# Patient Record
Sex: Female | Born: 1980 | Race: Black or African American | Hispanic: No | Marital: Single | Smoking: Current some day smoker
Health system: Southern US, Community
[De-identification: ages and names within clinical notes are randomized; demographics above are authoritative.]

## PROBLEM LIST (undated history)

## (undated) DIAGNOSIS — I10 Essential (primary) hypertension: Secondary | ICD-10-CM

## (undated) DIAGNOSIS — A4902 Methicillin resistant Staphylococcus aureus infection, unspecified site: Secondary | ICD-10-CM

## (undated) HISTORY — PX: INCISION AND DRAINAGE: SHX5863

---
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
## 2001-08-16 ENCOUNTER — Other Ambulatory Visit: Admission: RE | Admit: 2001-08-16 | Discharge: 2001-08-16 | Payer: Self-pay | Admitting: Obstetrics and Gynecology

## 2004-05-28 ENCOUNTER — Other Ambulatory Visit: Admission: RE | Admit: 2004-05-28 | Discharge: 2004-05-28 | Payer: Self-pay | Admitting: Obstetrics and Gynecology

## 2007-07-03 ENCOUNTER — Emergency Department (HOSPITAL_COMMUNITY): Admission: EM | Admit: 2007-07-03 | Discharge: 2007-07-04 | Payer: Self-pay | Admitting: Emergency Medicine

## 2007-07-24 ENCOUNTER — Encounter: Admission: RE | Admit: 2007-07-24 | Discharge: 2007-08-25 | Payer: Self-pay | Admitting: Family Medicine

## 2008-04-09 ENCOUNTER — Emergency Department (HOSPITAL_COMMUNITY): Admission: EM | Admit: 2008-04-09 | Discharge: 2008-04-09 | Payer: Self-pay | Admitting: Emergency Medicine

## 2008-09-16 ENCOUNTER — Emergency Department (HOSPITAL_COMMUNITY): Admission: EM | Admit: 2008-09-16 | Discharge: 2008-09-16 | Payer: Self-pay | Admitting: Emergency Medicine

## 2008-12-25 ENCOUNTER — Emergency Department (HOSPITAL_COMMUNITY): Admission: EM | Admit: 2008-12-25 | Discharge: 2008-12-25 | Payer: Self-pay | Admitting: Emergency Medicine

## 2009-07-18 ENCOUNTER — Emergency Department (HOSPITAL_COMMUNITY): Admission: EM | Admit: 2009-07-18 | Discharge: 2009-07-18 | Payer: Self-pay | Admitting: Emergency Medicine

## 2009-07-23 ENCOUNTER — Emergency Department (HOSPITAL_COMMUNITY): Admission: EM | Admit: 2009-07-23 | Discharge: 2009-07-23 | Payer: Self-pay | Admitting: Emergency Medicine

## 2010-02-09 ENCOUNTER — Emergency Department (HOSPITAL_COMMUNITY): Admission: EM | Admit: 2010-02-09 | Discharge: 2010-02-09 | Payer: Self-pay | Admitting: Emergency Medicine

## 2010-09-17 ENCOUNTER — Emergency Department (HOSPITAL_COMMUNITY): Admission: EM | Admit: 2010-09-17 | Discharge: 2010-09-17 | Payer: Self-pay | Admitting: Emergency Medicine

## 2010-09-21 ENCOUNTER — Emergency Department (HOSPITAL_COMMUNITY): Admission: EM | Admit: 2010-09-21 | Discharge: 2010-09-21 | Payer: Self-pay | Admitting: Emergency Medicine

## 2010-12-03 ENCOUNTER — Emergency Department (HOSPITAL_COMMUNITY): Admission: EM | Admit: 2010-12-03 | Discharge: 2010-05-28 | Payer: Self-pay | Admitting: Emergency Medicine

## 2011-04-04 LAB — CULTURE, ROUTINE-ABSCESS

## 2011-04-23 ENCOUNTER — Emergency Department (HOSPITAL_COMMUNITY)
Admission: EM | Admit: 2011-04-23 | Discharge: 2011-04-23 | Disposition: A | Discharge status: Home / Self Care | Payer: Medicaid Other | Attending: Emergency Medicine | Admitting: Emergency Medicine

## 2011-04-23 DIAGNOSIS — IMO0002 Reserved for concepts with insufficient information to code with codable children: Secondary | ICD-10-CM | POA: Insufficient documentation

## 2011-05-30 ENCOUNTER — Emergency Department (INDEPENDENT_AMBULATORY_CARE_PROVIDER_SITE_OTHER): Payer: Medicaid Other

## 2011-05-30 ENCOUNTER — Emergency Department (HOSPITAL_BASED_OUTPATIENT_CLINIC_OR_DEPARTMENT_OTHER)
Admission: EM | Admit: 2011-05-30 | Discharge: 2011-05-30 | Disposition: A | Discharge status: Home / Self Care | Payer: Medicaid Other | Attending: Emergency Medicine | Admitting: Emergency Medicine

## 2011-05-30 DIAGNOSIS — R079 Chest pain, unspecified: Secondary | ICD-10-CM | POA: Insufficient documentation

## 2011-05-30 DIAGNOSIS — R071 Chest pain on breathing: Secondary | ICD-10-CM | POA: Insufficient documentation

## 2011-10-12 LAB — DIFFERENTIAL
Basophils Absolute: 0
Eosinophils Absolute: 0
Eosinophils Relative: 0
Lymphocytes Relative: 26
Lymphs Abs: 1.4
Monocytes Relative: 7
Neutro Abs: 3.7
Neutrophils Relative %: 67

## 2011-10-12 LAB — BASIC METABOLIC PANEL
BUN: 4 — ABNORMAL LOW
Calcium: 9.4
Chloride: 100
Creatinine, Ser: 0.98
Sodium: 134 — ABNORMAL LOW

## 2011-10-12 LAB — CBC
Hemoglobin: 13.5
Platelets: 203
WBC: 5.5

## 2011-10-12 LAB — URINALYSIS, ROUTINE W REFLEX MICROSCOPIC
Glucose, UA: NEGATIVE
Hgb urine dipstick: NEGATIVE
Protein, ur: NEGATIVE

## 2012-06-16 ENCOUNTER — Encounter (HOSPITAL_COMMUNITY): Payer: Self-pay | Admitting: Emergency Medicine

## 2012-06-16 ENCOUNTER — Emergency Department (HOSPITAL_COMMUNITY)
Admission: EM | Admit: 2012-06-16 | Discharge: 2012-06-17 | Disposition: A | Discharge status: Home / Self Care | Payer: Medicaid Other | Attending: Emergency Medicine | Admitting: Emergency Medicine

## 2012-06-16 DIAGNOSIS — S0180XA Unspecified open wound of other part of head, initial encounter: Secondary | ICD-10-CM | POA: Insufficient documentation

## 2012-06-16 DIAGNOSIS — W19XXXA Unspecified fall, initial encounter: Secondary | ICD-10-CM | POA: Insufficient documentation

## 2012-06-16 HISTORY — DX: Methicillin resistant Staphylococcus aureus infection, unspecified site: A49.02

## 2012-06-16 NOTE — ED Notes (Signed)
Pt states she was walking to the concert and fell  Pt has a small laceration noted above her left eyebrow  Pt has been drinking tonight

## 2012-06-20 ENCOUNTER — Emergency Department (HOSPITAL_COMMUNITY): Payer: Self-pay

## 2012-06-20 ENCOUNTER — Encounter (HOSPITAL_COMMUNITY): Payer: Self-pay | Admitting: Emergency Medicine

## 2012-06-20 ENCOUNTER — Emergency Department (HOSPITAL_COMMUNITY)
Admission: EM | Admit: 2012-06-20 | Discharge: 2012-06-20 | Disposition: A | Discharge status: Home / Self Care | Payer: Self-pay | Attending: Emergency Medicine | Admitting: Emergency Medicine

## 2012-06-20 DIAGNOSIS — R51 Headache: Secondary | ICD-10-CM | POA: Insufficient documentation

## 2012-06-20 DIAGNOSIS — W010XXA Fall on same level from slipping, tripping and stumbling without subsequent striking against object, initial encounter: Secondary | ICD-10-CM | POA: Insufficient documentation

## 2012-06-20 DIAGNOSIS — M25519 Pain in unspecified shoulder: Secondary | ICD-10-CM | POA: Insufficient documentation

## 2012-06-20 LAB — POCT PREGNANCY, URINE: Preg Test, Ur: NEGATIVE

## 2012-06-20 MED ORDER — METHOCARBAMOL 500 MG PO TABS: 1000.0000 mg | ORAL_TABLET | Freq: Two times a day (BID) | ORAL | Status: AC

## 2012-06-20 MED ORDER — KETOROLAC TROMETHAMINE 60 MG/2ML IM SOLN
60.0000 mg | Freq: Once | INTRAMUSCULAR | Status: AC
  Administered 2012-06-20: 60 mg via INTRAMUSCULAR
  Filled 2012-06-20: qty 2

## 2012-06-20 MED ORDER — METHOCARBAMOL 500 MG PO TABS
1000.0000 mg | ORAL_TABLET | Freq: Once | ORAL | Status: AC
  Administered 2012-06-20: 1000 mg via ORAL
  Filled 2012-06-20: qty 2

## 2012-06-20 MED ORDER — IBUPROFEN 600 MG PO TABS: 600.0000 mg | ORAL_TABLET | Freq: Four times a day (QID) | ORAL | Status: AC | PRN

## 2012-06-20 NOTE — ED Notes (Signed)
Pt fell on Friday night while intoxicated  Pt came to hospital and left AMA  Pt states since her fall she has had headaches off and on and this evening she woke up and states her head feels funny and has a lot of pressure  Pt states she still having pain in her left side of her neck and shoulder that shoot down into her back

## 2012-06-20 NOTE — Discharge Instructions (Signed)
Your head CT and xray were normal and did not show signs of any broken bones or head injury. You have been given a prescription for ibuprofen and robaxin, a muscle relaxer. Take these as needed. Return to the ED with worsening pain, nausea/vomiting, or any other worrisome symptoms.  RESOURCE GUIDE  Chronic Pain Problems: Contact [facility]  [phone] Patients need to be referred by their primary care doctor.  Insufficient Money for Medicine: Contact United Way:  call "211" or [facility] 785-424-7883.  No Primary Care Doctor: - Call [facility]  [phone] - can help you locate a primary care doctor that  accepts your insurance, provides certain services, etc. - Physician Referral Service- [phone]  Agencies that provide inexpensive medical care: - [facility]  [phone] - [facility]  [phone] - [facility]  [phone] - Women's Clinic  [phone] - Planned Parenthood  [phone] [facility]  [phone]  Medicaid-accepting [facility] Providers: - [facility]- [address]  [phone], Mon-Fri 9am-7pm, Sat 9am-1pm - [facility]- [address] [place], Suite [place]  [phone] - [facility]- [address] [place]  [phone] [facility]- [address]  [phone] - Renaye Rakers- [address] [place], [address], [phone]  Only accepts [place] patients after they have their name  applied to their card  Self Pay (no insurance) in [place]: - Sickle Cell Patients: Dr Willey Blade, [place] [facility]  [address] [place], [phone] - [facility]- [address] [place]  [phone]       [facility] [place]- [address]       -     [facility]- see information above (Speak to [employer] if you do not have insurance)       -   [facility]- [address] [place], [phone]       -  [facility]- [address],  [phone]       -  [facility]- [address], [phone]       -  Dr Julio Sicks-  [address], [place], [phone]       -  [facility]- [address], [phone]       -  [facility]- [address], [phone], also [address] [phone]       -    [facility]- [address] [place], [phone], 1st & 3rd Saturday   every month, 10am-1pm  1) Find a Doctor and Pay Out of Pocket Although you won't have to find out who is covered by your insurance plan, it is a good idea to ask around and get recommendations. You will then need to call the office and see if the doctor you have chosen will accept you as a new patient and what types of options they offer for patients who are self-pay. Some doctors offer discounts or will set up payment plans for their patients who do not have insurance, but you will need to ask so you aren't surprised when you get to your appointment.  2) Contact Your Local Health Department Not all health departments have doctors that can see patients for sick visits, but  many do, so it is worth a call to see if yours does. If you don't know where your local health department is, you can check in your phone book. The CDC also has a tool to help you locate your state's health department, and many state websites also have listings of all of their local health departments.  3) Find a Walk-in Clinic If your illness is not likely to be very severe or complicated, you may want to try a walk in clinic. These are popping up all over the country in pharmacies, drugstores, and shopping centers. They're usually staffed by nurse practitioners or physician assistants that have been trained to treat common illnesses and complaints. They're usually fairly quick and inexpensive. However, if you have serious medical issues or  chronic medical problems, these are probably not your best option  STD Testing - [facility] Department of [facility] [place], STD Clinic, [address], [place], phone [phone] or [phone].  Monday - Friday, call for an appointment. [facility] Department of [employer], STD Clinic, [address], [place], phone [phone] or [phone].  Monday - Friday, call for an appointment.  Abuse/Neglect: [facility] Child Abuse Hotline 6503403139 [facility] Child Abuse Hotline (786)703-9561 (After Hours)  Emergency Shelter:  [facility] 318-301-5377  Maternity Homes: - [facility] at the [place] [facility] (303)286-9013 - [facility] 7097963182  MRSA Hotline #:   (361)751-9700  [facility] Resources  [facility]  [employer] [facility] Dept. [address]                 [address]         [address] [place] [address]  Blondell Reveal Phone:  [phone]                                  Phone:  [phone]                   Phone:  7277386046  [facility], [phone] - [facility] - [phone]       -     [facility] in [place], [address]                                  [phone], [facility] Child Abuse Hotline 843-547-4301 or 651-761-0936 (After Hours)   Behavioral Health Services  Substance Abuse Resources: - [facility]  (657) [phone] - [facility] [phone] - [facility] [place] (629)041-3410 Floydene Flock [phone] - Residential & Outpatient Substance Abuse Program  [phone]  Psychological Services: [facility]  8030649489 Services  [phone] - [facility], (507)330-4261 [place]. [address], [place], ACCESS LINE: (838)560-8517 or 347-348-7878,  [employer]an.[employer].za  Dental Assistance  If unable to pay or uninsured, contact:  [facility] or [facility]. to become qualified for the adult dental clinic.  Patients with Medicaid: [facility] [phone] [address], [phone] [address], [phone]  If unable to pay, or uninsured, contact [facility] (301) [phone]) or [facility] Department 9347119297 in [place], [phone] in [facility]) to become qualified for the adult dental clinic  Other Low-Cost Community Dental Services: - [facility]- [address] [place], [place], [place], [address], [phone], [address]. [phone], 2nd and 4th Thursday of the month at 6:30am.  10 clients each day by appointment, can sometimes see walk-in patients if someone does not show for an appointment. [facility]- [address] [place], [place], [address], [phone] - [facility]- [address], [place], [place], [address], [phone] [facility] Department- [phone] [facility] Department- [phone] [facility] [phone]  General Headache, Without Cause A general headache has no specific cause. These headaches are not life-threatening. They will not lead to other types of headaches. HOME CARE   Make and keep follow-up visits with your doctor.   Only take medicine as told by your doctor.   Try to relax, get a massage, or use your thoughts to control your body (biofeedback).   Apply cold or heat to the head and neck. Apply 3 or 4 times a day or as needed.  Finding out the results of your test Ask when your test results will be ready. Make sure you get your test results. GET HELP RIGHT AWAY IF:   You have problems with medicine.   Your medicine does not help relieve pain.   Your headache changes or becomes worse.   You feel sick  to your stomach (nauseous) or throw up (vomit).   You have a temperature by mouth above 102 F (38.9 C), not controlled by medicine.   Your have a stiff neck.   You have vision loss.   You have muscle weakness.   You lose control of your muscles.   You lose balance or have trouble walking.   You feel like you are going to pass out (faint).  MAKE SURE YOU:   Understand these instructions.   Will watch this condition.   Will get help right away if you are not doing well or get worse.  Document Released: 09/21/2008 Document Revised: 12/02/2011 Document Reviewed: 09/21/2008 [facility] Patient Information 2012 [place], [place].

## 2012-06-20 NOTE — ED Provider Notes (Signed)
History     CSN: [id number]  Arrival date & time 06/20/12  1931   First MD Initiated Contact with Patient 06/20/12 2052      Chief Complaint  Patient presents with  . Headache    (Consider location/radiation/quality/duration/timing/severity/associated sxs/prior treatment) HPI History from patient. 31 year old female who presents with headache and left shoulder pain. She states that she was walking outside on Friday night when she tripped and fell on the pavement, striking her forehead. She believes that she had a brief loss of consciousness with this. She had been drinking alcohol that evening. She came to the emergency department for evaluation but left before being seen. States that since this she has had persistent headaches to the frontal area which are described as dull and throbbing. She has had some slight associated dizziness, described as a sensation of lightheadedness. She denies any visual changes, photophobia, phonophobia, nausea, vomiting. No medication at home prior to coming here. Pain in left shoulder is located to posterior shoulder. It does not radiate. Worsens with movement. Denies numbness or weakness in her arm or hand.   Past Medical History  Diagnosis Date  . MRSA (methicillin resistant Staphylococcus aureus)     History reviewed. No pertinent past surgical history.  Family History  Problem Relation Age of Onset  . Hypertension Other   . Diabetes Other   . Coronary artery disease Other     History  Substance Use Topics  . Smoking status: Current Everyday Smoker    Types: Cigarettes  . Smokeless tobacco: Not on file  . Alcohol Use: Yes     heavy    OB History    Grav Para Term Preterm Abortions TAB SAB Ect Mult Living                  Review of Systems  Constitutional: Negative for fever, chills, activity change and appetite change.  HENT: Negative for neck pain and neck stiffness.   Respiratory: Negative for cough, chest tightness and shortness  of breath.   Cardiovascular: Negative for chest pain.  Musculoskeletal: Positive for myalgias.  Neurological: Positive for headaches. Negative for dizziness, weakness, light-headedness and numbness.  All other systems reviewed and are negative.    Allergies  Review of patient's allergies indicates no known allergies.  Home Medications   Current Outpatient Rx  Name Route Sig Dispense Refill  . IBUPROFEN 200 MG PO TABS Oral Take 200 mg by mouth every 6 (six) hours as needed.      BP 128/84  Pulse 94  Temp 97.9 F (36.6 C) (Oral)  Resp 20  SpO2 100%  LMP 05/28/2012  Physical Exam  Nursing note and vitals reviewed. Constitutional: She is oriented to person, place, and time. She appears well-developed and well-nourished. No distress.  HENT:  Head: Normocephalic.  Right Ear: External ear normal.  Left Ear: External ear normal.  Mouth/Throat: Oropharynx is clear and moist. No oropharyngeal exudate.       Negative battle's, raccoon's, hemotympanum  Eyes: EOM are normal. Pupils are equal, round, and reactive to light.  Neck: Normal range of motion. Neck supple.  Cardiovascular: Normal rate, regular rhythm and normal heart sounds.   Pulmonary/Chest: Effort normal and breath sounds normal. She exhibits no tenderness.  Abdominal: Soft. Bowel sounds are normal. There is no tenderness. There is no rebound and no guarding.  Musculoskeletal: Normal range of motion.       Left shoulder: She exhibits tenderness. She exhibits no bony tenderness, no swelling,  no effusion and no crepitus.       Arms: Neurological: She is alert and oriented to person, place, and time. No cranial nerve deficit. She exhibits normal muscle tone. Coordination normal.  Skin: Skin is warm and dry. She is not diaphoretic.       Healing laceration to the left lateral eyebrow, clean appearing  Psychiatric: She has a normal mood and affect.    ED Course  Procedures (including critical care time)  Labs Reviewed -  No data to display Ct Head Wo Contrast  06/20/2012  *RADIOLOGY REPORT*  Clinical Data: 31 year old female status post fall with headache.  CT HEAD WITHOUT CONTRAST  Technique:  Contiguous axial images were obtained from the base of the skull through the vertex without contrast.  Comparison: 09/17/2010.  Findings: Visualized paranasal sinuses and mastoids are clear. Visualized orbits and scalp soft tissues are within normal limits. No acute osseous abnormality identified.  Cerebral volume is within normal limits for age.  No midline shift, ventriculomegaly, mass effect, evidence of mass lesion, intracranial hemorrhage or evidence of cortically based acute infarction.  Gray-white matter differentiation is within normal limits throughout the brain.  No suspicious intracranial vascular hyperdensity.  IMPRESSION: Stable and normal noncontrast CT appearance of the brain.  Original Report Authenticated By: Harley Hallmark, M.D.   Dg Shoulder Left  06/20/2012  *RADIOLOGY REPORT*  Clinical Data: 31 year old female status post fall with pain.  LEFT SHOULDER - 2+ VIEW  Comparison: 09/17/2010.  Findings: Hair artifact over the left neck and lung apex. No glenohumeral joint dislocation.   Bone mineralization is within normal limits.  Proximal left humerus, left clavicle, and left scapula appears stable and intact.  Negative visualized left ribs and lung parenchyma.  IMPRESSION: No acute fracture or dislocation identified about the left shoulder.  Original Report Authenticated By: Harley Hallmark, M.D.     1. Headache       MDM  Patient presents with headache after a fall on Friday night. She had been drinking alcohol that evening. Has not been evaluated for this headache yet. No neurologic deficits on exam. Imaging of the shoulder and head are negative for acute findings. Patient instructed on symptomatic treatment. Prescriptions for ibuprofen and Robaxin. Reasons to return to the emergency department discussed. She  verbalized understanding and agreed to this plan.        Grant Fontana, PA-C 06/21/12 1601

## 2012-06-22 NOTE — ED Provider Notes (Signed)
Medical screening examination/treatment/procedure(s) were performed by non-physician practitioner and as supervising physician I was immediately available for consultation/collaboration.  Iraida Cragin T Alette Kataoka, MD 06/22/12 2315 

## 2013-02-13 ENCOUNTER — Encounter (HOSPITAL_COMMUNITY): Payer: Self-pay | Admitting: Emergency Medicine

## 2013-02-13 ENCOUNTER — Emergency Department (HOSPITAL_COMMUNITY)
Admission: EM | Admit: 2013-02-13 | Discharge: 2013-02-13 | Disposition: A | Discharge status: Home / Self Care | Payer: Medicaid Other | Attending: Emergency Medicine | Admitting: Emergency Medicine

## 2013-02-13 DIAGNOSIS — F17200 Nicotine dependence, unspecified, uncomplicated: Secondary | ICD-10-CM | POA: Insufficient documentation

## 2013-02-13 DIAGNOSIS — M545 Low back pain, unspecified: Secondary | ICD-10-CM | POA: Insufficient documentation

## 2013-02-13 DIAGNOSIS — Z791 Long term (current) use of non-steroidal anti-inflammatories (NSAID): Secondary | ICD-10-CM | POA: Insufficient documentation

## 2013-02-13 DIAGNOSIS — Z8614 Personal history of Methicillin resistant Staphylococcus aureus infection: Secondary | ICD-10-CM | POA: Insufficient documentation

## 2013-02-13 MED ORDER — HYDROCODONE-ACETAMINOPHEN 5-325 MG PO TABS
1.0000 | ORAL_TABLET | Freq: Once | ORAL | Status: AC
  Administered 2013-02-13: 1 via ORAL
  Filled 2013-02-13: qty 1

## 2013-02-13 MED ORDER — CYCLOBENZAPRINE HCL 10 MG PO TABS: 10.0000 mg | ORAL_TABLET | Freq: Two times a day (BID) | ORAL | Status: DC | PRN

## 2013-02-13 MED ORDER — HYDROCODONE-ACETAMINOPHEN 5-325 MG PO TABS: 1.0000 | ORAL_TABLET | ORAL | Status: DC | PRN

## 2013-02-13 NOTE — ED Provider Notes (Signed)
History     CSN: [id number]  Arrival date & time 02/13/13  1157   First MD Initiated Contact with Patient 02/13/13 1304      Chief Complaint  Patient presents with  . Back Pain    (Consider location/radiation/quality/duration/timing/severity/associated sxs/prior treatment) Patient is a 32 y.o. female presenting with back pain. The history is provided by the patient.  Back Pain Location:  Lumbar spine Associated symptoms: no fever and no numbness   Associated symptoms comment:  Recurrent lower back pain for one week without known injury. She has had multiple episodes in the past without diagnosis. No numbness, incontinence, weakness.    Past Medical History  Diagnosis Date  . MRSA (methicillin resistant Staphylococcus aureus)     History reviewed. No pertinent past surgical history.  Family History  Problem Relation Age of Onset  . Hypertension Other   . Diabetes Other   . Coronary artery disease Other     History  Substance Use Topics  . Smoking status: Current Every Day Smoker    Types: Cigarettes  . Smokeless tobacco: Not on file  . Alcohol Use: Yes     Comment: heavy    OB History   Grav Para Term Preterm Abortions TAB SAB Ect Mult Living                  Review of Systems  Constitutional: Negative for fever and chills.  Gastrointestinal: Negative.  Negative for nausea.  Genitourinary: Negative.  Negative for difficulty urinating.  Musculoskeletal: Positive for back pain.  Skin: Negative.   Neurological: Negative.  Negative for numbness.    Allergies  Review of patient's allergies indicates no known allergies.  Home Medications   Current Outpatient Rx  Name  Route  Sig  Dispense  Refill  . Celecoxib (CELEBREX PO)   Oral   Take 1 tablet by mouth 2 (two) times daily.         . cyclobenzaprine (FLEXERIL) 10 MG tablet   Oral   Take 10 mg by mouth at bedtime as needed for muscle spasms.           BP 129/89  Pulse 86  Temp(Src) 98 F  (36.7 C) (Oral)  Resp 18  SpO2 98%  Physical Exam  Constitutional: She appears well-developed and well-nourished.  HENT:  Head: Normocephalic.  Neck: Normal range of motion. Neck supple.  Cardiovascular: Normal rate and regular rhythm.   Pulmonary/Chest: Effort normal and breath sounds normal.  Abdominal: Soft. Bowel sounds are normal. There is no tenderness. There is no rebound and no guarding.  Musculoskeletal: Normal range of motion.  Mild lumbar and paralumbar tenderness without swelling or discoloration.  Neurological: She is alert. She has normal reflexes. No cranial nerve deficit. Coordination normal.  Skin: Skin is warm and dry. No rash noted.  Psychiatric: She has a normal mood and affect.    ED Course  Procedures (including critical care time)  Labs Reviewed - No data to display No results found.   No diagnosis found.  1. Low back pain  MDM  Musculoskeletal back pain without complication.        Arnoldo Hooker, PA-C 02/13/13 1342

## 2013-02-13 NOTE — ED Provider Notes (Signed)
Medical screening examination/treatment/procedure(s) were performed by non-physician practitioner and as supervising physician I was immediately available for consultation/collaboration.   Carleene Cooper III, MD 02/13/13 [phone]

## 2013-02-13 NOTE — ED Notes (Signed)
Pt c/o lower back pain with radiation down left leg x 1 week; pt denies obvious injury

## 2013-05-02 ENCOUNTER — Encounter (HOSPITAL_COMMUNITY): Payer: Self-pay | Admitting: Emergency Medicine

## 2013-05-02 ENCOUNTER — Emergency Department (HOSPITAL_COMMUNITY): Payer: Medicaid Other

## 2013-05-02 ENCOUNTER — Emergency Department (HOSPITAL_COMMUNITY)
Admission: EM | Admit: 2013-05-02 | Discharge: 2013-05-02 | Disposition: A | Discharge status: Home / Self Care | Payer: Medicaid Other | Attending: Emergency Medicine | Admitting: Emergency Medicine

## 2013-05-02 DIAGNOSIS — F17200 Nicotine dependence, unspecified, uncomplicated: Secondary | ICD-10-CM | POA: Insufficient documentation

## 2013-05-02 DIAGNOSIS — M545 Low back pain, unspecified: Secondary | ICD-10-CM | POA: Insufficient documentation

## 2013-05-02 DIAGNOSIS — I10 Essential (primary) hypertension: Secondary | ICD-10-CM | POA: Insufficient documentation

## 2013-05-02 DIAGNOSIS — Z8614 Personal history of Methicillin resistant Staphylococcus aureus infection: Secondary | ICD-10-CM | POA: Insufficient documentation

## 2013-05-02 DIAGNOSIS — Z3202 Encounter for pregnancy test, result negative: Secondary | ICD-10-CM | POA: Insufficient documentation

## 2013-05-02 HISTORY — DX: Essential (primary) hypertension: I10

## 2013-05-02 LAB — URINALYSIS, ROUTINE W REFLEX MICROSCOPIC
Nitrite: NEGATIVE
Specific Gravity, Urine: 1.036 — ABNORMAL HIGH (ref 1.005–1.030)
pH: 5.5 (ref 5.0–8.0)

## 2013-05-02 LAB — POCT I-STAT, CHEM 8
BUN: 5 mg/dL — ABNORMAL LOW (ref 6–23)
Chloride: 105 mEq/L (ref 96–112)
Sodium: 142 mEq/L (ref 135–145)

## 2013-05-02 LAB — URINE MICROSCOPIC-ADD ON

## 2013-05-02 MED ORDER — NAPROXEN 500 MG PO TABS: 500.0000 mg | ORAL_TABLET | Freq: Two times a day (BID) | ORAL | Status: DC

## 2013-05-02 MED ORDER — ONDANSETRON HCL 4 MG/2ML IJ SOLN
4.0000 mg | Freq: Once | INTRAMUSCULAR | Status: AC
  Administered 2013-05-02: 4 mg via INTRAVENOUS
  Filled 2013-05-02: qty 2

## 2013-05-02 MED ORDER — MORPHINE SULFATE 4 MG/ML IJ SOLN
4.0000 mg | Freq: Once | INTRAMUSCULAR | Status: AC
  Administered 2013-05-02: 4 mg via INTRAVENOUS
  Filled 2013-05-02: qty 1

## 2013-05-02 MED ORDER — METHOCARBAMOL 500 MG PO TABS: 500.0000 mg | ORAL_TABLET | Freq: Two times a day (BID) | ORAL | Status: DC

## 2013-05-02 MED ORDER — HYDROCODONE-ACETAMINOPHEN 5-325 MG PO TABS: 1.0000 | ORAL_TABLET | Freq: Four times a day (QID) | ORAL | Status: DC | PRN

## 2013-05-02 MED ORDER — SODIUM CHLORIDE 0.9 % IV BOLUS (SEPSIS)
1000.0000 mL | Freq: Once | INTRAVENOUS | Status: AC
  Administered 2013-05-02: 1000 mL via INTRAVENOUS

## 2013-05-02 NOTE — ED Provider Notes (Signed)
History     CSN: [id number]  Arrival date & time 05/02/13  0506   First MD Initiated Contact with Patient 05/02/13 [phone]      Chief Complaint  Patient presents with  . Flank Pain    (Consider location/radiation/quality/duration/timing/severity/associated sxs/prior treatment) HPI Comments: Patient presents with a chief complaint of left flank pain.  Pain radiates into the left groin area.  She reports that the pain has been intermittent over the past 2 days.  She states that the pain is sharp at times and comes in spasms.  She denies prior history of Kidney Stones.  No injury or trauma.  She denies any heavy lifting or strenuous activity..  She reports that she had two episodes of vomiting two days ago, but no vomiting since that time.  She denies hematuria, dysuria, urinary frequency, or urgency.  Denies diarrhea.  Denies fever or chills.  She has taken Ibuprofen for her pain prior to arrival without relief.  Nothing makes pain better or worse.    Patient is a 32 y.o. female presenting with flank pain. The history is provided by the patient.  Flank Pain Pertinent negatives include no chills or fever.    Past Medical History  Diagnosis Date  . MRSA (methicillin resistant Staphylococcus aureus)   . Hypertension     Past Surgical History  Procedure Laterality Date  . Incision and drainage      Family History  Problem Relation Age of Onset  . Hypertension Other   . Diabetes Other   . Coronary artery disease Other     History  Substance Use Topics  . Smoking status: Current Every Day Smoker    Types: Cigarettes  . Smokeless tobacco: Not on file  . Alcohol Use: Yes     Comment: heavy    OB History   Grav Para Term Preterm Abortions TAB SAB Ect Mult Living                  Review of Systems  Constitutional: Negative for fever and chills.  Genitourinary: Positive for flank pain. Negative for dysuria and vaginal discharge.  All other systems reviewed and are  negative.    Allergies  Review of patient's allergies indicates no known allergies.  Home Medications  No current outpatient prescriptions on file.  BP 133/68  Pulse 104  Temp(Src) 98 F (36.7 C) (Oral)  Resp 14  SpO2 99%  LMP 04/21/2013  Physical Exam  Nursing note and vitals reviewed. Constitutional: She appears well-developed and well-nourished. No distress.  HENT:  Head: Normocephalic and atraumatic.  Mouth/Throat: Oropharynx is clear and moist.  Neck: Normal range of motion. Neck supple.  Cardiovascular: Normal rate, regular rhythm and normal heart sounds.   Pulmonary/Chest: Effort normal and breath sounds normal.  Abdominal: Soft. Bowel sounds are normal. She exhibits no distension and no mass. There is no tenderness. There is CVA tenderness. There is no rebound and no guarding.  Left CVA tenderness  Neurological: She is alert.  Skin: Skin is warm and dry. She is not diaphoretic.  Psychiatric: She has a normal mood and affect.    ED Course  Procedures (including critical care time)  Labs Reviewed  URINALYSIS, ROUTINE W REFLEX MICROSCOPIC - Abnormal; Notable for the following:    Color, Urine AMBER (*)    APPearance CLOUDY (*)    Specific Gravity, Urine 1.036 (*)    Bilirubin Urine SMALL (*)    Ketones, ur 15 (*)    Leukocytes, UA  TRACE (*)    All other components within normal limits  URINE MICROSCOPIC-ADD ON - Abnormal; Notable for the following:    Squamous Epithelial / LPF MANY (*)    Bacteria, UA MANY (*)    Crystals CA OXALATE CRYSTALS (*)    All other components within normal limits  POCT PREGNANCY, URINE   Ct Abdomen Pelvis Wo Contrast  05/02/2013  *RADIOLOGY REPORT*  Clinical Data: Left-sided flank pain, left hip pain, no known injury  CT ABDOMEN AND PELVIS WITHOUT CONTRAST  Technique:  Multidetector CT imaging of the abdomen and pelvis was performed following the standard protocol without intravenous contrast.  Comparison: None.  Findings: The lack  of intravenous contrast limits the ability to evaluate solid abdominal organs.  Normal noncontrast appearance of the bilateral kidneys.  No renal stones.  No stones are seen overlying expected location of either ureter or the urinary bladder.  No perinephric stranding.  The urinary bladder is underdistended otherwise normal.  Normal hepatic contour.  Normal noncontrast appearance of the gallbladder.  No ascites.  Normal noncontrast appearance of the bilateral adrenal glands, pancreas and spleen.  The bowel is normal in course and caliber without wall thickening or evidence of obstruction.  The appendix is not can be identified, however there are no inflammatory change within the right lower abdominal quadrant.  No pneumoperitoneum, pneumatosis or portal venous gas.  Normal caliber of the abdominal aorta.  No definite retroperitoneal, mesenteric, pelvic or inguinal lymphadenopathy on this noncontrast examination, but note, examination is further degraded secondary to lack of significant intra-abdominal mesenteric fat.  There is a small amount of fluid within the endometrial canal, presumably physiologic.  The uterus is anteverted.  No discrete adnexal lesion.  No free fluid within the pelvis.  Limited visualization of the lower thorax is negative for focal airspace opacity or pleural effusion.  Normal heart size.  No pericardial effusion.  No acute or aggressive osseous abnormalities with special attention paid to the left hip.  IMPRESSION:  No explanation for patient's left-sided flank and hip pain. Specifically, no evidence of nephrolithiasis or urinary obstruction.  Normal appearance of the left hip.   Original Report Authenticated By: Tacey Ruiz, MD      No diagnosis found.  8:25 AM Pain has improved at this time.  MDM  Patient presenting with left flank pain, which was suspicious for kidney stone.  Negative CT ab/pelvis w/o nephrolithiasis.  No acute findings.  UA negative for infection.  Creatine  WNL.  Patient stable for discharge.  Patient discharged home with prescription for pain medication and muscle relaxer.        Pascal Lux Alfred, PA-C 05/02/13 [phone]

## 2013-05-02 NOTE — ED Notes (Signed)
PT. REPORTS LEFT FLANK PAIN /LEFT HIP PAIN ONSET 2 DAYS AGO DENIES INJURY OR FALL , AMBULATORY , NO HEMATURIA OR DYSURIA , NO FEVER OR CHILLS.

## 2013-05-02 NOTE — ED Notes (Signed)
Patient transported to CT 

## 2013-05-04 NOTE — ED Provider Notes (Signed)
Medical screening examination/treatment/procedure(s) were performed by non-physician practitioner and as supervising physician I was immediately available for consultation/collaboration.   Lyanne Co, MD 05/04/13 [phone]

## 2013-08-28 ENCOUNTER — Emergency Department (HOSPITAL_COMMUNITY)
Admission: EM | Admit: 2013-08-28 | Discharge: 2013-08-28 | Disposition: A | Discharge status: Home / Self Care | Payer: Self-pay | Attending: Emergency Medicine | Admitting: Emergency Medicine

## 2013-08-28 ENCOUNTER — Encounter (HOSPITAL_COMMUNITY): Payer: Self-pay | Admitting: *Deleted

## 2013-08-28 ENCOUNTER — Emergency Department (HOSPITAL_COMMUNITY): Payer: Self-pay

## 2013-08-28 DIAGNOSIS — Z8614 Personal history of Methicillin resistant Staphylococcus aureus infection: Secondary | ICD-10-CM | POA: Insufficient documentation

## 2013-08-28 DIAGNOSIS — F17200 Nicotine dependence, unspecified, uncomplicated: Secondary | ICD-10-CM | POA: Insufficient documentation

## 2013-08-28 DIAGNOSIS — I10 Essential (primary) hypertension: Secondary | ICD-10-CM | POA: Insufficient documentation

## 2013-08-28 DIAGNOSIS — R0789 Other chest pain: Secondary | ICD-10-CM

## 2013-08-28 DIAGNOSIS — R071 Chest pain on breathing: Secondary | ICD-10-CM | POA: Insufficient documentation

## 2013-08-28 LAB — CBC
HCT: 40.8 % (ref 36.0–46.0)
MCV: 91.9 fL (ref 78.0–100.0)
Platelets: 230 10*3/uL (ref 150–400)
RBC: 4.44 MIL/uL (ref 3.87–5.11)
WBC: 4.3 10*3/uL (ref 4.0–10.5)

## 2013-08-28 LAB — BASIC METABOLIC PANEL
CO2: 25 mEq/L (ref 19–32)
Calcium: 9.5 mg/dL (ref 8.4–10.5)
Chloride: 101 mEq/L (ref 96–112)
Sodium: 138 mEq/L (ref 135–145)

## 2013-08-28 LAB — PRO B NATRIURETIC PEPTIDE: Pro B Natriuretic peptide (BNP): 59.4 pg/mL (ref 0–125)

## 2013-08-28 MED ORDER — IBUPROFEN 800 MG PO TABS
800.0000 mg | ORAL_TABLET | Freq: Once | ORAL | Status: AC
  Administered 2013-08-28: 800 mg via ORAL
  Filled 2013-08-28: qty 1

## 2013-08-28 NOTE — ED Notes (Signed)
Reports having sharp left side chest pains and sob for several days, reports intermittent numbness to left shoulder. Denies any recent cough or illness, reprots hx of pericarditis. ekg done at triage.

## 2013-08-28 NOTE — ED Provider Notes (Signed)
CSN: [id number]     Arrival date & time 08/28/13  1235 History   First MD Initiated Contact with Patient 08/28/13 1620     Chief Complaint  Patient presents with  . Chest Pain   (Consider location/radiation/quality/duration/timing/severity/associated sxs/prior Treatment) HPI Comments: 32 yo female with hx of pericarditis vs costochondritis presents with left chest pain worse with movement and palpation since Friday.  Similar to previous.  Patient denies blood clot history, active cancer, recent major trauma or surgery, unilateral leg swelling/ pain, recent long travel, hemoptysis or oral contraceptives.  Improves with not moving.  No exertional or diaphoresis. Mild sob.   Patient is a 32 y.o. female presenting with chest pain. The history is provided by the patient.  Chest Pain Pain location:  L chest Associated symptoms: no abdominal pain, no back pain, no fever, no headache, no shortness of breath and not vomiting     Past Medical History  Diagnosis Date  . MRSA (methicillin resistant Staphylococcus aureus)   . Hypertension    Past Surgical History  Procedure Laterality Date  . Incision and drainage     Family History  Problem Relation Age of Onset  . Hypertension Other   . Diabetes Other   . Coronary artery disease Other    History  Substance Use Topics  . Smoking status: Current Every Day Smoker    Types: Cigarettes  . Smokeless tobacco: Not on file  . Alcohol Use: Yes     Comment: heavy   OB History   Grav Para Term Preterm Abortions TAB SAB Ect Mult Living                 Review of Systems  Constitutional: Negative for fever and chills.  HENT: Negative for neck pain and neck stiffness.   Eyes: Negative for visual disturbance.  Respiratory: Negative for shortness of breath.   Cardiovascular: Positive for chest pain.  Gastrointestinal: Negative for vomiting and abdominal pain.  Genitourinary: Negative for dysuria and flank pain.  Musculoskeletal: Negative for  back pain.  Skin: Negative for rash.  Neurological: Negative for light-headedness and headaches.    Allergies  Review of patient's allergies indicates no known allergies.  Home Medications   Current Outpatient Rx  Name  Route  Sig  Dispense  Refill  . ALPRAZolam (XANAX XR) 0.5 MG 24 hr tablet   Oral   Take 0.5 mg by mouth daily as needed (aniexty).          BP 115/87  Pulse 77  Temp(Src) 98.2 F (36.8 C) (Oral)  Resp 18  SpO2 99%  LMP 08/20/2013 Physical Exam  Nursing note and vitals reviewed. Constitutional: She is oriented to person, place, and time. She appears well-developed and well-nourished. No distress.  HENT:  Head: Normocephalic and atraumatic.  Eyes: Conjunctivae are normal. Right eye exhibits no discharge. Left eye exhibits no discharge.  Neck: Normal range of motion. Neck supple. No tracheal deviation present.  Cardiovascular: Normal rate, regular rhythm and intact distal pulses.   No murmur heard. Pulmonary/Chest: Effort normal and breath sounds normal.  Abdominal: Soft. She exhibits no distension. There is no tenderness. There is no guarding.  Musculoskeletal: She exhibits tenderness (left costochondral junction anterior chest). She exhibits no edema.  Neurological: She is alert and oriented to person, place, and time.  Skin: Skin is warm. No rash noted.  Psychiatric: She has a normal mood and affect.    ED Course  Procedures (including critical care time)   EMERGENCY  DEPARTMENT [place] CARDIAC EXAM "Study: Limited Ultrasound of the heart and pericardium"  INDICATIONS:chest pain, hx of pericarditis Multiple views of the heart and pericardium were obtained in real-time with a multi-frequency probe.  PERFORMED ZO:XWRUEA  IMAGES ARCHIVED?: Yes  FINDINGS: No pericardial effusion  LIMITATIONS: none  VIEWS USED: Subcostal 4 chamber, Parasternal long axis, Parasternal short axis and Apical 4 chamber   INTERPRETATION: Cardiac activity present,  Pericardial effusioin absent, Cardiac tamponade absent and Normal contractility   Labs Review Labs Reviewed  BASIC METABOLIC PANEL - Abnormal; Notable for the following:    Potassium 3.3 (*)    Glucose, Bld 137 (*)    BUN 5 (*)    GFR calc non Af Amer 73 (*)    GFR calc Af Amer 85 (*)    All other components within normal limits  PRO B NATRIURETIC PEPTIDE  CBC  POCT I-STAT TROPONIN I   Imaging Review Dg Chest 2 View  08/28/2013   *RADIOLOGY REPORT*  Clinical Data: Chest pain  CHEST - 2 VIEW  Comparison: May 30, 2011  Findings: Lungs clear.  Heart size and pulmonary vascularity are normal.  No adenopathy.  No pneumothorax.  There is mild mid thoracic levoscoliosis.  IMPRESSION: No edema or consolidation.   Original Report Authenticated By: Bretta Bang, M.D.    MDM   1. Chest wall pain    MSK vs pericarditis. PERC neg.  Very low risk cardiac and PE. Hx of pericardiits, bedside US no effusion. Clinically costochondritis, very tender on exam. Ibuprofen and close fup outpt. DC well appearing, pain improved on recheck.     Enid Skeens, MD 08/30/13 [phone]

## 2013-08-28 NOTE — ED Notes (Signed)
Dr Zavitz at bedside  

## 2013-08-28 NOTE — ED Notes (Signed)
Pt c/o left sided sharp cp that radiates to left shoulder and back that started a few days ago. Pt states pain gets worse when she moves and reports sob. Pt rates pain 7/10. Reports she had pericarditis a few years ago and this feels like the same.

## 2013-11-09 ENCOUNTER — Encounter (HOSPITAL_COMMUNITY): Payer: Self-pay | Admitting: Emergency Medicine

## 2013-11-09 ENCOUNTER — Emergency Department (HOSPITAL_COMMUNITY)
Admission: EM | Admit: 2013-11-09 | Discharge: 2013-11-09 | Discharge status: Against Medical Advice | Payer: Medicaid Other | Attending: Emergency Medicine | Admitting: Emergency Medicine

## 2013-11-09 DIAGNOSIS — F419 Anxiety disorder, unspecified: Secondary | ICD-10-CM

## 2013-11-09 DIAGNOSIS — Z3202 Encounter for pregnancy test, result negative: Secondary | ICD-10-CM | POA: Insufficient documentation

## 2013-11-09 DIAGNOSIS — F17200 Nicotine dependence, unspecified, uncomplicated: Secondary | ICD-10-CM | POA: Insufficient documentation

## 2013-11-09 DIAGNOSIS — Z8614 Personal history of Methicillin resistant Staphylococcus aureus infection: Secondary | ICD-10-CM | POA: Insufficient documentation

## 2013-11-09 DIAGNOSIS — F411 Generalized anxiety disorder: Secondary | ICD-10-CM | POA: Insufficient documentation

## 2013-11-09 DIAGNOSIS — F1010 Alcohol abuse, uncomplicated: Secondary | ICD-10-CM | POA: Insufficient documentation

## 2013-11-09 DIAGNOSIS — I10 Essential (primary) hypertension: Secondary | ICD-10-CM | POA: Insufficient documentation

## 2013-11-09 LAB — RAPID URINE DRUG SCREEN, HOSP PERFORMED
Amphetamines: NOT DETECTED
Barbiturates: NOT DETECTED
Cocaine: NOT DETECTED
Opiates: NOT DETECTED

## 2013-11-09 LAB — CBC
MCH: 31.7 pg (ref 26.0–34.0)
MCHC: 33.6 g/dL (ref 30.0–36.0)
MCV: 94.5 fL (ref 78.0–100.0)
Platelets: 225 10*3/uL (ref 150–400)
RBC: 4.16 MIL/uL (ref 3.87–5.11)

## 2013-11-09 LAB — SALICYLATE LEVEL: Salicylate Lvl: <2 mg/dL — ABNORMAL LOW (ref 2.8–20.0)

## 2013-11-09 LAB — ACETAMINOPHEN LEVEL: Acetaminophen (Tylenol), Serum: <15 ug/mL (ref 10–30)

## 2013-11-09 LAB — COMPREHENSIVE METABOLIC PANEL
ALT: 15 U/L (ref 0–35)
AST: 20 U/L (ref 0–37)
Albumin: 3.7 g/dL (ref 3.5–5.2)
CO2: 25 mEq/L (ref 19–32)
Calcium: 9.2 mg/dL (ref 8.4–10.5)
Creatinine, Ser: 1.01 mg/dL (ref 0.50–1.10)
GFR calc Af Amer: 86 mL/min — ABNORMAL LOW (ref >90)
GFR calc non Af Amer: 74 mL/min — ABNORMAL LOW (ref >90)
Glucose, Bld: 99 mg/dL (ref 70–99)
Total Protein: 7.1 g/dL (ref 6.0–8.3)

## 2013-11-09 MED ORDER — IBUPROFEN 200 MG PO TABS: 600.0000 mg | ORAL_TABLET | Freq: Three times a day (TID) | ORAL | Status: DC | PRN

## 2013-11-09 MED ORDER — ALUM & MAG HYDROXIDE-SIMETH 200-200-20 MG/5ML PO SUSP: 30.0000 mL | ORAL | Status: DC | PRN

## 2013-11-09 MED ORDER — ONDANSETRON HCL 4 MG PO TABS: 4.0000 mg | ORAL_TABLET | Freq: Three times a day (TID) | ORAL | Status: DC | PRN

## 2013-11-09 MED ORDER — ZOLPIDEM TARTRATE 5 MG PO TABS: 5.0000 mg | ORAL_TABLET | Freq: Every evening | ORAL | Status: DC | PRN

## 2013-11-09 MED ORDER — ACETAMINOPHEN 325 MG PO TABS: 650.0000 mg | ORAL_TABLET | ORAL | Status: DC | PRN

## 2013-11-09 MED ORDER — NICOTINE 21 MG/24HR TD PT24: 21.0000 mg | MEDICATED_PATCH | Freq: Every day | TRANSDERMAL | Status: DC

## 2013-11-09 NOTE — BH Assessment (Signed)
[facility] Assessment Progress Note  Prior to the assessment, attempted to contact the provider, however she was on the phone and a message was left with her to discuss patient with her once assessment was completed, as patient had been waiting and there was no need to delay care. After the assessment was completed, called back to speak with Ellin Saba, PA-C; informed she had left and was transferred to next mid-level provider. Attempted to review assessment, but she stated that I would need to discuss the case with the psychiatrist and discuss with them the disposition, as she was not comfortable discussing the case with me as she had not seen the patient. She stated I would need to discuss with the psychiatric MD concerning the disposition of the patient.   Contacted Maryjean Morn, PA-C and left message on his cell phone to discuss patient.   Shon Baton, MSW, LCSW

## 2013-11-09 NOTE — ED Notes (Signed)
Pt's Parol officer requesting get detox from alcohol.  Pt went to [facility] ED and was told to set up an apt with [facility]. Pt went to [facility] this morning and was told they could set her up on Thursday 11/20.

## 2013-11-09 NOTE — ED Provider Notes (Signed)
Medical screening examination/treatment/procedure(s) were performed by non-physician practitioner and as supervising physician I was immediately available for consultation/collaboration.  EKG Interpretation   None         Celene Kras, MD 11/09/13 2007

## 2013-11-09 NOTE — ED Notes (Signed)
Pt being changed into blue scrubs and belongings given to mom

## 2013-11-09 NOTE — BH Assessment (Signed)
Tele Assessment Note   Morgan [facility] is an 32 y.o. female. She presents voluntarily in the Emergency Department requesting an assessment. She explains she was sent by her probation officer due to calling her while under the influence of alcohol. She reluctantly admits she was intoxicated when she called her. Her PO instructed her to go to treatment or she would be violated and placed in jail. The patient states she was assessed and then seen by [facility]. They did not have any inpatient beds. Therefore, they gave her an appointment date of November 20 to return. She states she gave this information to the PO, however, she was told to either come for the assessment or go to jail.   The patient did participate in the assessment. She was very cooperative. She is pleasant and answered all questions. She reports she completed a rehabilitation/detox about two years ago. She was able to remain abstinent for about 4-5 months after she completed the program. She did participate in AA and outpatient [facility], however, once she returned to her home, she began to use once again. She reports now that she normally she drinks just on the weekends, however, when she is having significant stressors, she will begin to drink as well. She drinks 2-3 times per week. She reports her last drink was 11/07/13; and she drank 1/2 pint of liquor and 3 beers. She denies any withdrawal symptoms, no hx of seizures and her CIWA is 0. Prior to her last rehab, she drank daily; and had experienced blackouts, but this is not occurring now.  She also admits to using Cannabis. She reports her last use was today, a joint, but states she only took a couple of tokes off of it.  She currently denies any other drugs of abuse. She reports her substance abuse has affected her life; including having 3 DWIs, 2 of those DWIs were in one year. According to the public [facility], she has also had possession charges and drug para charges as well. She has  served prison time. She reports that she has been diagnosed with PTSD, in which she remarks were from things that happened to her when she was a child. She is reluctant to go into the details of those events. She reports she did have an appointment to meet with a psychiatrist as it was felt she may depression or some type of mood disorder, but she admits she did not go to the appointment and she is not a "big pill person." She also states that she suffers from anxiety and has had panic attacks in 2009; in which she was prescribed benzodiazepines, but she didn't like to take them. She is reluctant to seek treatment for anxiety and depression; though it is not clear why. She denies having any thoughts of hurting herself or hurting anyone else. She is not experiencing any delusions or hallucinations currently. She is appropriate and states she will be staying with her mother until she enters into the rehabilitation program.   Discussed with patient her options, as she revealed she has a daughter who is about to turn 1 this weekend. Patient appears to need a structured, community based program. Discussed the [facility] program with her, as she could take her child with her, and receive SA tx, MH tx, job training, parenting, support groups, etc. She declined this option. She states she has reluctance on the rehab program at [facility]; though I feel this is mainly due to her self-esteem, as she mentioned  feeling like she is going back there and they know wasn't successful the first time.   Patient is likely under-reporting her substance use, however, she is at least motivated to complete the assessment this evening and reports she will be following through with the appointment on November 20. She was encouraged to contact her sponsor and to look for a structured program when she completes [facility].    Axis I: Substance Abuse; History of PTSD, Generalized Anxiety Disorder Axis II: Deferred Axis III: HTN Axis IV:  Significant social and environmental stressors, including possible probation violation/revocation due to continued substance abuse; significant relational issues with family, friends and significant others; inability to maintain financial stability  Axis V: GAF 60-69  Past Medical History:  Past Medical History  Diagnosis Date  . MRSA (methicillin resistant Staphylococcus aureus)   . Hypertension     Past Surgical History  Procedure Laterality Date  . Incision and drainage      Family History:  Family History  Problem Relation Age of Onset  . Hypertension Other   . Diabetes Other   . Coronary artery disease Other     Social History:  reports that she has been smoking Cigarettes.  She has been smoking about 0.00 packs per day. She does not have any smokeless tobacco history on file. She reports that she drinks alcohol. She reports that she does not use illicit drugs.  Additional Social History:     CIWA:   COWS:    Allergies: No Known Allergies  Home Medications:  (Not in a [facility] admission)  OB/GYN Status:  Patient's last menstrual period was 10/30/2013.              Risk to self Is patient at risk for suicide?: No, but patient needs Medical Clearance Substance abuse history and/or treatment for substance abuse?: Yes                                 Values / Beliefs Cultural Requests During Hospitalization: None Spiritual Requests During Hospitalization: None              Disposition: Patient is not experiencing any withdrawal symptoms. Her last reported use of alcohol was 11/07/13. Her CIWA is O. Patient denies SI/HI, no delusions or hallucinations. She does not meet criteria for inpatient hospitalization for detox. Patient has already been referred to a rehab program. Patient desires to follow up with the appointment given to her for November 20 at [facility].     M[facility] 11/09/2013 8:35 PM

## 2013-11-09 NOTE — ED Provider Notes (Signed)
CSN: [id number]     Arrival date & time 11/09/13  1655 History  This chart was scribed for Marlon Pel, PA-C, working with Celene Kras, MD, by Ardelia Mems ED Scribe. This patient was seen in room WTR2/WLPT2 and the patient's care was started at 5:23 PM.  Chief Complaint  Patient presents with  . Medical Clearance    The history is provided by the patient. No language interpreter was used.    HPI Comments: [facility] is a 32 y.o. female who presents to the Emergency Department requesting medical clearance in order to undergo detox from alcohol. She states that she is here voluntarily, but that she is being strongly encouraged to be here by her parole officer. She states that she was seen at [facility] ED yesterday for this and was told to set up an appointment with [facility]. She states that she went to [facility] this morning and was told she could begin rehab on 11/15/13. She states that she last drank alcohol 2 days ago. She denies SI, HI, auditory or visual hallucinations or any other symptoms. She states that she has no chronic medical conditions. She is a current every day smoker. She states that she is an occasional marijuana user and that she uses no other illicit or prescription drugs.   Past Medical History  Diagnosis Date  . MRSA (methicillin resistant Staphylococcus aureus)   . Hypertension    Past Surgical History  Procedure Laterality Date  . Incision and drainage     Family History  Problem Relation Age of Onset  . Hypertension Other   . Diabetes Other   . Coronary artery disease Other    History  Substance Use Topics  . Smoking status: Current Every Day Smoker    Types: Cigarettes  . Smokeless tobacco: Not on file  . Alcohol Use: Yes     Comment: heavy   OB History   Grav Para Term Preterm Abortions TAB SAB Ect Mult Living                 Review of Systems  Psychiatric/Behavioral: Negative for suicidal ideas, hallucinations, self-injury and agitation.        Denies HI.  All other systems reviewed and are negative.   Allergies  Review of patient's allergies indicates no known allergies.  Home Medications   Current Outpatient Rx  Name  Route  Sig  Dispense  Refill  . ALPRAZolam (XANAX XR) 0.5 MG 24 hr tablet   Oral   Take 0.5 mg by mouth daily as needed (aniexty).          LMP 10/30/2013  Physical Exam  Nursing note and vitals reviewed. Constitutional: She is oriented to person, place, and time. She appears well-developed and well-nourished. No distress.  HENT:  Head: Normocephalic and atraumatic.  Eyes: EOM are normal.  Neck: Neck supple. No tracheal deviation present.  Cardiovascular: Normal rate.   Pulmonary/Chest: Effort normal. No respiratory distress.  Musculoskeletal: Normal range of motion.  Neurological: She is alert and oriented to person, place, and time.  Skin: Skin is warm and dry.  Psychiatric: Her behavior is normal. Her mood appears anxious. She is not actively hallucinating. She does not exhibit a depressed mood. She expresses no homicidal and no suicidal ideation. She expresses no suicidal plans and no homicidal plans.    ED Course  Procedures (including critical care time)  COORDINATION OF CARE: 5:28 PM- Pt advised of plan for treatment and pt agrees.  Labs Review  Labs Reviewed  ACETAMINOPHEN LEVEL  CBC  COMPREHENSIVE METABOLIC PANEL  ETHANOL  SALICYLATE LEVEL  URINE RAPID DRUG SCREEN (HOSP PERFORMED)   Imaging Review No results found.  EKG Interpretation   None       MDM   1. Alcohol abuse   2. Anxiety    Holding orders placed Home med rec completed  TTS consult ordered  Screening labs pending  I personally performed the services described in this documentation, which was scribed in my presence. The recorded information has been reviewed and is accurate.    Dorthula Matas, PA-C 11/09/13 1733

## 2013-12-24 ENCOUNTER — Emergency Department (HOSPITAL_BASED_OUTPATIENT_CLINIC_OR_DEPARTMENT_OTHER)
Admission: EM | Admit: 2013-12-24 | Discharge: 2013-12-24 | Disposition: A | Discharge status: Home / Self Care | Payer: Medicaid Other | Attending: Emergency Medicine | Admitting: Emergency Medicine

## 2013-12-24 ENCOUNTER — Encounter (HOSPITAL_BASED_OUTPATIENT_CLINIC_OR_DEPARTMENT_OTHER): Payer: Self-pay | Admitting: Emergency Medicine

## 2013-12-24 DIAGNOSIS — I10 Essential (primary) hypertension: Secondary | ICD-10-CM | POA: Insufficient documentation

## 2013-12-24 DIAGNOSIS — Z8614 Personal history of Methicillin resistant Staphylococcus aureus infection: Secondary | ICD-10-CM | POA: Insufficient documentation

## 2013-12-24 DIAGNOSIS — Z79899 Other long term (current) drug therapy: Secondary | ICD-10-CM | POA: Insufficient documentation

## 2013-12-24 DIAGNOSIS — H6120 Impacted cerumen, unspecified ear: Secondary | ICD-10-CM | POA: Insufficient documentation

## 2013-12-24 DIAGNOSIS — F17200 Nicotine dependence, unspecified, uncomplicated: Secondary | ICD-10-CM | POA: Insufficient documentation

## 2013-12-24 DIAGNOSIS — J329 Chronic sinusitis, unspecified: Secondary | ICD-10-CM | POA: Insufficient documentation

## 2013-12-24 DIAGNOSIS — H6123 Impacted cerumen, bilateral: Secondary | ICD-10-CM

## 2013-12-24 MED ORDER — IBUPROFEN 800 MG PO TABS: 800.0000 mg | ORAL_TABLET | Freq: Three times a day (TID) | ORAL | Status: DC

## 2013-12-24 MED ORDER — AMOXICILLIN 500 MG PO CAPS: 500.0000 mg | ORAL_CAPSULE | Freq: Three times a day (TID) | ORAL | Status: AC

## 2013-12-24 NOTE — ED Notes (Signed)
C/o "sinus HA" x 1-1.5 weeks-with dizziness and right ear ache

## 2013-12-24 NOTE — ED Provider Notes (Signed)
CSN: [id number]     Arrival date & time 12/24/13  1816 History   First MD Initiated Contact with Patient 12/24/13 2204     Chief Complaint  Patient presents with  . Headache   (Consider location/radiation/quality/duration/timing/severity/associated sxs/prior Treatment) Patient is a 32 y.o. female presenting with headaches. The history is provided by the patient. No language interpreter was used.  Headache Pain location:  Generalized Quality:  Sharp Radiates to:  Does not radiate Severity currently:  0/10 Severity at highest:  0/10 Onset quality:  Sudden Timing:  Constant Progression:  Worsening Similar to prior headaches: no   Relieved by:  Nothing Worsened by:  Nothing tried Ineffective treatments:  None tried Pt thinks she has a sinus nfection  Past Medical History  Diagnosis Date  . MRSA (methicillin resistant Staphylococcus aureus)   . Hypertension    Past Surgical History  Procedure Laterality Date  . Incision and drainage     Family History  Problem Relation Age of Onset  . Hypertension Other   . Diabetes Other   . Coronary artery disease Other    History  Substance Use Topics  . Smoking status: Current Every Day Smoker    Types: Cigarettes  . Smokeless tobacco: Not on file  . Alcohol Use: Yes     Comment: heavy   OB History   Grav Para Term Preterm Abortions TAB SAB Ect Mult Living                 Review of Systems  Neurological: Positive for headaches.  All other systems reviewed and are negative.    Allergies  Review of patient's allergies indicates no known allergies.  Home Medications   Current Outpatient Rx  Name  Route  Sig  Dispense  Refill  . FLUoxetine HCl (PROZAC PO)   Oral   Take by mouth.         . ALPRAZolam (XANAX XR) 0.5 MG 24 hr tablet   Oral   Take 0.5 mg by mouth daily as needed (aniexty).          BP 104/76  Pulse 78  Temp(Src) 98.4 F (36.9 C) (Oral)  Resp 16  Ht 5\' 10"  (1.778 m)  Wt 162 lb (73.483 kg)   BMI 23.24 kg/m2  SpO2 99%  LMP 12/17/2013 Physical Exam  Nursing note and vitals reviewed. Constitutional: She is oriented to person, place, and time. She appears well-developed and well-nourished.  HENT:  Head: Normocephalic.  Eyes: Conjunctivae and EOM are normal. Pupils are equal, round, and reactive to light.  Neck: Normal range of motion.  Cardiovascular: Normal rate and normal heart sounds.   Pulmonary/Chest: Effort normal.  Abdominal: Soft.  Musculoskeletal: Normal range of motion.  Neurological: She is alert and oriented to person, place, and time. She has normal reflexes.  Skin: Skin is warm.  Psychiatric: She has a normal mood and affect.    ED Course  Procedures (including critical care time) Labs Review Labs Reviewed - No data to display Imaging Review No results found.  EKG Interpretation   None     Pt is at [facility]  No narcotics  MDM   1. Sinusitis   2. Cerumen impaction, bilateral    ear canals irrigatted,   amoxicillian and ibuprofen.      Lonia Skinner Elcho, PA-C 12/24/13 2332

## 2013-12-24 NOTE — ED Notes (Signed)
Ha x 1.5 weeks.  Nom relief w otc meds,  Slight ear ache x 2 days

## 2013-12-25 NOTE — ED Provider Notes (Signed)
Medical screening examination/treatment/procedure(s) were performed by non-physician practitioner and as supervising physician I was immediately available for consultation/collaboration.  EKG Interpretation   None         Glynn Octave, MD 12/25/13 [phone]

## 2014-06-22 ENCOUNTER — Encounter (HOSPITAL_COMMUNITY): Payer: Self-pay | Admitting: Emergency Medicine

## 2014-06-22 DIAGNOSIS — I10 Essential (primary) hypertension: Secondary | ICD-10-CM | POA: Insufficient documentation

## 2014-06-22 DIAGNOSIS — L02219 Cutaneous abscess of trunk, unspecified: Secondary | ICD-10-CM | POA: Insufficient documentation

## 2014-06-22 DIAGNOSIS — F17200 Nicotine dependence, unspecified, uncomplicated: Secondary | ICD-10-CM | POA: Insufficient documentation

## 2014-06-22 DIAGNOSIS — L03319 Cellulitis of trunk, unspecified: Principal | ICD-10-CM

## 2014-06-22 NOTE — ED Notes (Signed)
The pt has an abscess in her lt groin since Tuesday.  She has been squeezing on it and it has become more painful

## 2014-06-23 ENCOUNTER — Encounter (HOSPITAL_COMMUNITY): Payer: Self-pay | Admitting: Emergency Medicine

## 2014-06-23 ENCOUNTER — Emergency Department (HOSPITAL_COMMUNITY)
Admission: EM | Admit: 2014-06-23 | Discharge: 2014-06-23 | Discharge status: Against Medical Advice | Payer: Medicaid Other | Attending: Emergency Medicine | Admitting: Emergency Medicine

## 2014-06-23 ENCOUNTER — Emergency Department (HOSPITAL_COMMUNITY)
Admission: EM | Admit: 2014-06-23 | Discharge: 2014-06-23 | Disposition: A | Discharge status: Home / Self Care | Payer: Medicaid Other | Attending: Emergency Medicine | Admitting: Emergency Medicine

## 2014-06-23 DIAGNOSIS — Z79899 Other long term (current) drug therapy: Secondary | ICD-10-CM | POA: Insufficient documentation

## 2014-06-23 DIAGNOSIS — L02219 Cutaneous abscess of trunk, unspecified: Secondary | ICD-10-CM | POA: Insufficient documentation

## 2014-06-23 DIAGNOSIS — L02214 Cutaneous abscess of groin: Secondary | ICD-10-CM

## 2014-06-23 DIAGNOSIS — I10 Essential (primary) hypertension: Secondary | ICD-10-CM | POA: Insufficient documentation

## 2014-06-23 DIAGNOSIS — L03319 Cellulitis of trunk, unspecified: Principal | ICD-10-CM

## 2014-06-23 DIAGNOSIS — F17200 Nicotine dependence, unspecified, uncomplicated: Secondary | ICD-10-CM | POA: Insufficient documentation

## 2014-06-23 DIAGNOSIS — Z8614 Personal history of Methicillin resistant Staphylococcus aureus infection: Secondary | ICD-10-CM | POA: Insufficient documentation

## 2014-06-23 MED ORDER — SULFAMETHOXAZOLE-TRIMETHOPRIM 800-160 MG PO TABS: 1.0000 | ORAL_TABLET | Freq: Two times a day (BID) | ORAL | Status: DC

## 2014-06-23 NOTE — ED Provider Notes (Signed)
CSN: [id number]     Arrival date & time 06/23/14  1002 History   None   This chart was scribed for Thomasenia SalesJessica Katlin Palmer PA-C, a non-physician practitioner working with Gilda Creasehristopher J. Pollina, * by Lewanda RifeAlexandra Hurtado, ED Scribe. This patient was seen in room TR11C/TR11C and the patient's care was started at 11:05 AM    Chief Complaint  Patient presents with  . Abscess    The history is provided by the patient. No language interpreter was used.   HPI Comments: Morgan Hammond is a 33 y.o. female who presents to the Emergency Department with PMHx MRSA complaining of abscess on left groin onset 6 days. States it "started with an ingrown hair." Describes abscess as gradually worsening in severity. Reports trying OTC pain medications with no relief of symptoms. Denies any aggravating or alleviating factors. Denies associated fever, dysuria, and drainage. Reports PMHx of I&D in same area 5 years ago. LNMP 06/10/14. No concerns for pregnancy.    Past Medical History  Diagnosis Date  . MRSA (methicillin resistant Staphylococcus aureus)   . Hypertension    Past Surgical History  Procedure Laterality Date  . Incision and drainage     Family History  Problem Relation Age of Onset  . Hypertension Other   . Diabetes Other   . Coronary artery disease Other    History  Substance Use Topics  . Smoking status: Current Every Day Smoker    Types: Cigarettes  . Smokeless tobacco: Not on file  . Alcohol Use: Yes     Comment: heavy   OB History   Grav Para Term Preterm Abortions TAB SAB Ect Mult Living                 Review of Systems  Constitutional: Negative for fever, chills, activity change, appetite change and fatigue.  Gastrointestinal: Negative for nausea, vomiting and abdominal pain.  Genitourinary: Negative for dysuria and difficulty urinating.  Skin: Negative for color change and wound.       abscess    Allergies  Review of patient's allergies indicates no known  allergies.  Home Medications   Prior to Admission medications   Medication Sig Start Date End Date Taking? Authorizing Provider  acetaminophen (TYLENOL) 500 MG tablet Take 1,000 mg by mouth every 6 (six) hours as needed for mild pain.   Yes Historical Provider, MD   BP 131/92  Pulse 80  Temp(Src) 98.2 F (36.8 C) (Oral)  Resp 16  SpO2 100%  LMP 06/10/2014  Filed Vitals:   06/23/14 1022 06/23/14 1251  BP: 131/92 113/78  Pulse: 80 60  Temp: 98.2 F (36.8 C) 98.5 F (36.9 C)  TempSrc: Oral Oral  Resp: 16 16  SpO2: 100% 100%    Physical Exam  Nursing note and vitals reviewed. Constitutional: She is oriented to person, place, and time. She appears well-developed and well-nourished. No distress.  Non-toxic  HENT:  Head: Normocephalic and atraumatic.  Eyes: Conjunctivae and EOM are normal.  Neck: Neck supple.  Cardiovascular: Normal rate.   Pulmonary/Chest: Effort normal. No respiratory distress.  Genitourinary:  Bilateral inguinal LAD. Lymph nodes firm, non-tender, and mobile equal bilaterally.   Musculoskeletal: Normal range of motion.       Legs: Lymphadenopathy:       Right: Inguinal adenopathy present.       Left: Inguinal adenopathy present.  Neurological: She is alert and oriented to person, place, and time.  Skin: Skin is warm and dry. No erythema.  2  cm x 1 cm abscess noted to left groin. Minimal fluctuance noted. There is overlying erythema. No surrounding erythema or cellulitis. No open wounds or drainage.   Psychiatric: She has a normal mood and affect. Her behavior is normal.    ED Course  Procedures (including critical care time) COORDINATION OF CARE:  Nursing notes reviewed. Vital signs reviewed. Initial pt interview and examination performed.   Filed Vitals:   06/23/14 1022  BP: 131/92  Pulse: 80  Temp: 98.2 F (36.8 C)  TempSrc: Oral  Resp: 16  SpO2: 100%    12:38 PM-Discussed work up plan with pt at bedside, which includes No orders of  the defined types were placed in this encounter.  . Pt agrees with plan.   Treatment plan initiated:Medications - No data to display   Initial diagnostic testing ordered.     12:39 PM INCISION AND DRAINAGE Performed by: Luiz IronJessica Katlin Palmer PA-C Consent: Verbal consent obtained. Risks and benefits: risks, benefits and alternatives were discussed Time out performed prior to procedure Type: abscess Body area: left groin  Anesthesia: local infiltration Incision was made with a scalpel. Local anesthetic: lidocaine 2% without epinephrine Anesthetic total: 2 ml Complexity: complex Blunt dissection to break up loculations Drainage: purulent Drainage amount: mild to moderate  Packing material: 1/4" Iodoform gauze  Patient tolerance: Patient tolerated the procedure well with no immediate complications.   Labs Review Labs Reviewed - No data to display  Imaging Review No results found.   EKG Interpretation None      MDM   Morgan Hammond is a 33 y.o. female who presents to the Emergency Department with PMHx MRSA complaining of abscess on left groin onset 6 days. Abscess I&D'd in the ED. Patient afebrile and non-toxic in appearance. Vital signs stable. Will place patient on antibiotics. Hx of MRSA. Return for packing removal in 2 days. Abscess care discussed with patient. Return precautions, discharge instructions, and follow-up was discussed with the patient before discharge.     Discharge Medication List as of 06/23/2014 12:48 PM    START taking these medications   Details  sulfamethoxazole-trimethoprim (SEPTRA DS) 800-160 MG per tablet Take 1 tablet by mouth every 12 (twelve) hours., Starting 06/23/2014, Until Discontinued, Print        Final impressions: 1. Abscess of groin, left      Luiz IronJessica Katlin Palmer PA-C   I personally performed the services described in this documentation, which was scribed in my presence. The recorded information has been reviewed and is  accurate.        Jillyn LedgerJessica K Palmer, PA-C 06/23/14 1422

## 2014-06-23 NOTE — ED Notes (Signed)
Pt reports having abscess to left groin since Tuesday.

## 2014-06-23 NOTE — Discharge Instructions (Signed)
Warm compresses several times a day  Return to the emergency department if you develop any changing/worsening condition, increased drainage, fever, spreading redness/swelling or any other concerns (please read additional information regarding your condition below)  Abscess An abscess is an infected area that contains a collection of pus and debris.It can occur in almost any part of the body. An abscess is also known as a furuncle or boil. CAUSES  An abscess occurs when tissue gets infected. This can occur from blockage of oil or sweat glands, infection of hair follicles, or a minor injury to the skin. As the body tries to fight the infection, pus collects in the area and creates pressure under the skin. This pressure causes pain. People with weakened immune systems have difficulty fighting infections and get certain abscesses more often.  SYMPTOMS Usually an abscess develops on the skin and becomes a painful mass that is red, warm, and tender. If the abscess forms under the skin, you may feel a moveable soft area under the skin. Some abscesses break open (rupture) on their own, but most will continue to get worse without care. The infection can spread deeper into the body and eventually into the bloodstream, causing you to feel ill.  DIAGNOSIS  Your caregiver will take your medical history and perform a physical exam. A sample of fluid may also be taken from the abscess to determine what is causing your infection. TREATMENT  Your caregiver may prescribe antibiotic medicines to fight the infection. However, taking antibiotics alone usually does not cure an abscess. Your caregiver may need to make a small cut (incision) in the abscess to drain the pus. In some cases, gauze is packed into the abscess to reduce pain and to continue draining the area. HOME CARE INSTRUCTIONS   Only take over-the-counter or prescription medicines for pain, discomfort, or fever as directed by your caregiver.  If you were  prescribed antibiotics, take them as directed. Finish them even if you start to feel better.  If gauze is used, follow your caregiver's directions for changing the gauze.  To avoid spreading the infection:  Keep your draining abscess covered with a bandage.  Wash your hands well.  Do not share personal care items, towels, or whirlpools with others.  Avoid skin contact with others.  Keep your skin and clothes clean around the abscess.  Keep all follow-up appointments as directed by your caregiver. SEEK MEDICAL CARE IF:   You have increased pain, swelling, redness, fluid drainage, or bleeding.  You have muscle aches, chills, or a general ill feeling.  You have a fever. MAKE SURE YOU:   Understand these instructions.  Will watch your condition.  Will get help right away if you are not doing well or get worse. Document Released: 09/22/2005 Document Revised: 06/13/2012 Document Reviewed: 02/25/2012 [facility] Patient Information 2015 [place] [place]. This information is not intended to replace advice given to you by your health care provider. Make sure you discuss any questions you have with your health care provider.

## 2014-06-24 NOTE — ED Provider Notes (Signed)
Medical screening examination/treatment/procedure(s) were performed by non-physician practitioner and as supervising physician I was immediately available for consultation/collaboration.   EKG Interpretation None        Christopher J. Pollina, MD 06/24/14 1623 

## 2014-10-22 ENCOUNTER — Emergency Department (HOSPITAL_BASED_OUTPATIENT_CLINIC_OR_DEPARTMENT_OTHER)
Admission: EM | Admit: 2014-10-22 | Discharge: 2014-10-22 | Disposition: A | Discharge status: Home / Self Care | Payer: Medicaid Other | Attending: Emergency Medicine | Admitting: Emergency Medicine

## 2014-10-22 ENCOUNTER — Encounter (HOSPITAL_BASED_OUTPATIENT_CLINIC_OR_DEPARTMENT_OTHER): Payer: Self-pay | Admitting: Emergency Medicine

## 2014-10-22 DIAGNOSIS — R51 Headache: Secondary | ICD-10-CM | POA: Insufficient documentation

## 2014-10-22 DIAGNOSIS — I10 Essential (primary) hypertension: Secondary | ICD-10-CM | POA: Insufficient documentation

## 2014-10-22 DIAGNOSIS — Z8614 Personal history of Methicillin resistant Staphylococcus aureus infection: Secondary | ICD-10-CM | POA: Insufficient documentation

## 2014-10-22 DIAGNOSIS — R0981 Nasal congestion: Secondary | ICD-10-CM | POA: Insufficient documentation

## 2014-10-22 DIAGNOSIS — Z3202 Encounter for pregnancy test, result negative: Secondary | ICD-10-CM | POA: Insufficient documentation

## 2014-10-22 DIAGNOSIS — R519 Headache, unspecified: Secondary | ICD-10-CM

## 2014-10-22 DIAGNOSIS — Z720 Tobacco use: Secondary | ICD-10-CM | POA: Insufficient documentation

## 2014-10-22 DIAGNOSIS — Z792 Long term (current) use of antibiotics: Secondary | ICD-10-CM | POA: Insufficient documentation

## 2014-10-22 LAB — PREGNANCY, URINE: Preg Test, Ur: NEGATIVE

## 2014-10-22 MED ORDER — DEXAMETHASONE SODIUM PHOSPHATE 10 MG/ML IJ SOLN
10.0000 mg | Freq: Once | INTRAMUSCULAR | Status: AC
  Administered 2014-10-22: 10 mg via INTRAVENOUS
  Filled 2014-10-22: qty 1

## 2014-10-22 MED ORDER — METOCLOPRAMIDE HCL 5 MG/ML IJ SOLN
10.0000 mg | Freq: Once | INTRAMUSCULAR | Status: AC
  Administered 2014-10-22: 10 mg via INTRAVENOUS
  Filled 2014-10-22: qty 2

## 2014-10-22 MED ORDER — DIPHENHYDRAMINE HCL 50 MG/ML IJ SOLN
25.0000 mg | Freq: Once | INTRAMUSCULAR | Status: AC
  Administered 2014-10-22: 25 mg via INTRAVENOUS
  Filled 2014-10-22: qty 1

## 2014-10-22 MED ORDER — SODIUM CHLORIDE 0.9 % IV BOLUS (SEPSIS)
1000.0000 mL | INTRAVENOUS | Status: AC
  Administered 2014-10-22: 1000 mL via INTRAVENOUS

## 2014-10-22 MED ORDER — KETOROLAC TROMETHAMINE 30 MG/ML IJ SOLN
30.0000 mg | Freq: Once | INTRAMUSCULAR | Status: AC
  Administered 2014-10-22: 30 mg via INTRAVENOUS
  Filled 2014-10-22: qty 1

## 2014-10-22 NOTE — ED Notes (Signed)
Reports HA x 2 weeks. Sts sinus congestion, now becoming more dizzy.

## 2014-10-22 NOTE — ED Provider Notes (Signed)
CSN: [id number]     Arrival date & time 10/22/14  1628 History   First MD Initiated Contact with Patient 10/22/14 1642     Chief Complaint  Patient presents with  . Headache     (Consider location/radiation/quality/duration/timing/severity/associated sxs/prior Treatment) Patient is a 33 y.o. female presenting with headaches. The history is provided by the patient.  Headache Pain location:  Occipital (retro-orbital) Quality:  Dull Radiates to:  Does not radiate Severity currently:  7/10 Severity at highest:  10/10 Onset quality:  Gradual Duration:  2 weeks Timing:  Constant Progression:  Waxing and waning Chronicity:  Recurrent Similar to prior headaches: yes   Context: bright light   Relieved by:  Nothing Worsened by:  Nothing tried Ineffective treatments: sinus HA meds, goodies  Associated symptoms: congestion   Associated symptoms: no abdominal pain, no back pain, no cough, no diarrhea, no dizziness, no pain, no fatigue, no fever, no nausea, no neck pain and no vomiting     Past Medical History  Diagnosis Date  . MRSA (methicillin resistant Staphylococcus aureus)   . Hypertension    Past Surgical History  Procedure Laterality Date  . Incision and drainage     Family History  Problem Relation Age of Onset  . Hypertension Other   . Diabetes Other   . Coronary artery disease Other    History  Substance Use Topics  . Smoking status: Current Every Day Smoker    Types: Cigarettes  . Smokeless tobacco: Not on file  . Alcohol Use: Yes     Comment: heavy   OB History   Grav Para Term Preterm Abortions TAB SAB Ect Mult Living                 Review of Systems  Constitutional: Negative for fever and fatigue.  HENT: Positive for congestion. Negative for drooling.   Eyes: Negative for pain.  Respiratory: Negative for cough and shortness of breath.   Cardiovascular: Negative for chest pain.  Gastrointestinal: Negative for nausea, vomiting, abdominal pain and  diarrhea.  Genitourinary: Negative for dysuria and hematuria.  Musculoskeletal: Negative for back pain, gait problem and neck pain.  Skin: Negative for color change.  Neurological: Positive for headaches. Negative for dizziness.  Hematological: Negative for adenopathy.  Psychiatric/Behavioral: Negative for behavioral problems.  All other systems reviewed and are negative.     Allergies  Review of patient's allergies indicates no known allergies.  Home Medications   Prior to Admission medications   Medication Sig Start Date End Date Taking? Authorizing Provider  acetaminophen (TYLENOL) 500 MG tablet Take 1,000 mg by mouth every 6 (six) hours as needed for mild pain.    Historical Provider, MD  sulfamethoxazole-trimethoprim (SEPTRA DS) 800-160 MG per tablet Take 1 tablet by mouth every 12 (twelve) hours. 06/23/14   Jillyn LedgerJessica K Palmer, PA-C   BP 116/73  Pulse 82  Temp(Src) 97.9 F (36.6 C) (Oral)  Resp 20  Ht 5\' 10"  (1.778 m)  Wt 150 lb (68.04 kg)  BMI 21.52 kg/m2  SpO2 100%  LMP 10/14/2014 Physical Exam  Nursing note and vitals reviewed. Constitutional: She is oriented to person, place, and time. She appears well-developed and well-nourished.  HENT:  Head: Normocephalic and atraumatic.  Mouth/Throat: Oropharynx is clear and moist. No oropharyngeal exudate.  Eyes: Conjunctivae and EOM are normal. Pupils are equal, round, and reactive to light.  Neck: Normal range of motion. Neck supple.  Cardiovascular: Normal rate, regular rhythm, normal heart sounds and intact distal  pulses.  Exam reveals no gallop and no friction rub.   No murmur heard. Pulmonary/Chest: Effort normal and breath sounds normal. No respiratory distress. She has no wheezes.  Abdominal: Soft. Bowel sounds are normal. There is no tenderness. There is no rebound and no guarding.  Musculoskeletal: Normal range of motion. She exhibits no edema and no tenderness.  alert, oriented x3 speech: normal in context and  clarity memory: intact grossly cranial nerves II-XII: intact motor strength: full proximally and distally no involuntary movements or tremors sensation: intact to light touch diffusely  cerebellar: finger-to-nose and heel-to-shin intact gait: normal forwards and backwards  Neurological: She is alert and oriented to person, place, and time.  Skin: Skin is warm and dry.  Psychiatric: She has a normal mood and affect. Her behavior is normal.    ED Course  Procedures (including critical care time) Labs Review Labs Reviewed  PREGNANCY, URINE    Imaging Review No results found.   EKG Interpretation None      MDM   Final diagnoses:  Headache, unspecified headache type    5:27 PM 33 y.o. female with a history of headaches presents with a headache. She states that she had a gradual onset sinus headache which has been waxing and waning for 2 weeks. Currently complains of occipital and retro-orbital pain. She denies any fevers or head injury. Vital signs are unremarkable here with a normal neurologic exam. Headache is consistent with previous headaches. Will treat with migraine cocktail.  7:17 PM: HA resolved.  I have discussed the diagnosis/risks/treatment options with the patient and believe the pt to be eligible for discharge home to follow-up with her pcp as needed. We also discussed returning to the ED immediately if new or worsening sx occur. We discussed the sx which are most concerning (e.g., worsening HA, fever) that necessitate immediate return. Medications administered to the patient during their visit and any new prescriptions provided to the patient are listed below.  Medications given during this visit Medications  sodium chloride 0.9 % bolus 1,000 mL (1,000 mLs Intravenous New Bag/Given 10/22/14 1748)  diphenhydrAMINE (BENADRYL) injection 25 mg (25 mg Intravenous Given 10/22/14 1748)  metoCLOPramide (REGLAN) injection 10 mg (10 mg Intravenous Given 10/22/14 1748)   dexamethasone (DECADRON) injection 10 mg (10 mg Intravenous Given 10/22/14 1748)  ketorolac (TORADOL) 30 MG/ML injection 30 mg (30 mg Intravenous Given 10/22/14 1838)    New Prescriptions   No medications on file     Purvis SheffieldForrest Diago Haik, MD 10/22/14 2328

## 2015-10-05 ENCOUNTER — Emergency Department (HOSPITAL_BASED_OUTPATIENT_CLINIC_OR_DEPARTMENT_OTHER): Payer: No Typology Code available for payment source

## 2015-10-05 ENCOUNTER — Encounter (HOSPITAL_BASED_OUTPATIENT_CLINIC_OR_DEPARTMENT_OTHER): Payer: Self-pay | Admitting: Emergency Medicine

## 2015-10-05 ENCOUNTER — Emergency Department (HOSPITAL_BASED_OUTPATIENT_CLINIC_OR_DEPARTMENT_OTHER)
Admission: EM | Admit: 2015-10-05 | Discharge: 2015-10-05 | Disposition: A | Discharge status: Home / Self Care | Payer: No Typology Code available for payment source | Attending: Emergency Medicine | Admitting: Emergency Medicine

## 2015-10-05 DIAGNOSIS — Z8614 Personal history of Methicillin resistant Staphylococcus aureus infection: Secondary | ICD-10-CM | POA: Diagnosis not present

## 2015-10-05 DIAGNOSIS — Y9389 Activity, other specified: Secondary | ICD-10-CM | POA: Diagnosis not present

## 2015-10-05 DIAGNOSIS — I10 Essential (primary) hypertension: Secondary | ICD-10-CM | POA: Diagnosis not present

## 2015-10-05 DIAGNOSIS — Y92410 Unspecified street and highway as the place of occurrence of the external cause: Secondary | ICD-10-CM | POA: Insufficient documentation

## 2015-10-05 DIAGNOSIS — Z3202 Encounter for pregnancy test, result negative: Secondary | ICD-10-CM | POA: Diagnosis not present

## 2015-10-05 DIAGNOSIS — Z720 Tobacco use: Secondary | ICD-10-CM | POA: Diagnosis not present

## 2015-10-05 DIAGNOSIS — S79911A Unspecified injury of right hip, initial encounter: Secondary | ICD-10-CM | POA: Diagnosis present

## 2015-10-05 DIAGNOSIS — S7001XA Contusion of right hip, initial encounter: Secondary | ICD-10-CM

## 2015-10-05 DIAGNOSIS — Y998 Other external cause status: Secondary | ICD-10-CM | POA: Diagnosis not present

## 2015-10-05 LAB — PREGNANCY, URINE: PREG TEST UR: NEGATIVE

## 2015-10-05 MED ORDER — IBUPROFEN 800 MG PO TABS
800.0000 mg | ORAL_TABLET | Freq: Once | ORAL | Status: AC
  Administered 2015-10-05: 800 mg via ORAL
  Filled 2015-10-05: qty 1

## 2015-10-05 MED ORDER — IBUPROFEN 800 MG PO TABS: 800.0000 mg | ORAL_TABLET | Freq: Four times a day (QID) | ORAL | Status: DC | PRN
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
## 2015-10-05 NOTE — ED Notes (Signed)
Obtained urine pregnancy test prior to sending patient to xray. Pt reports that she is currently having her menstrual cycle.
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
## 2015-10-05 NOTE — Discharge Instructions (Signed)

## 2015-10-17 ENCOUNTER — Encounter (HOSPITAL_BASED_OUTPATIENT_CLINIC_OR_DEPARTMENT_OTHER): Payer: Self-pay | Admitting: Emergency Medicine

## 2015-10-17 ENCOUNTER — Emergency Department (HOSPITAL_BASED_OUTPATIENT_CLINIC_OR_DEPARTMENT_OTHER): Payer: No Typology Code available for payment source

## 2015-10-17 ENCOUNTER — Emergency Department (HOSPITAL_BASED_OUTPATIENT_CLINIC_OR_DEPARTMENT_OTHER)
Admission: EM | Admit: 2015-10-17 | Discharge: 2015-10-17 | Disposition: A | Discharge status: Home / Self Care | Payer: No Typology Code available for payment source | Attending: Emergency Medicine | Admitting: Emergency Medicine

## 2015-10-17 DIAGNOSIS — Z8614 Personal history of Methicillin resistant Staphylococcus aureus infection: Secondary | ICD-10-CM | POA: Diagnosis not present

## 2015-10-17 DIAGNOSIS — Z720 Tobacco use: Secondary | ICD-10-CM | POA: Diagnosis not present

## 2015-10-17 DIAGNOSIS — M25512 Pain in left shoulder: Secondary | ICD-10-CM | POA: Diagnosis present

## 2015-10-17 DIAGNOSIS — M542 Cervicalgia: Secondary | ICD-10-CM | POA: Diagnosis not present

## 2015-10-17 DIAGNOSIS — M62838 Other muscle spasm: Secondary | ICD-10-CM | POA: Diagnosis not present

## 2015-10-17 DIAGNOSIS — I10 Essential (primary) hypertension: Secondary | ICD-10-CM | POA: Insufficient documentation

## 2015-10-17 MED ORDER — IBUPROFEN 800 MG PO TABS: 800.0000 mg | ORAL_TABLET | Freq: Three times a day (TID) | ORAL | Status: DC

## 2015-10-17 MED ORDER — KETOROLAC TROMETHAMINE 60 MG/2ML IM SOLN
60.0000 mg | Freq: Once | INTRAMUSCULAR | Status: AC
  Administered 2015-10-17: 60 mg via INTRAMUSCULAR
  Filled 2015-10-17: qty 2

## 2015-10-17 MED ORDER — CYCLOBENZAPRINE HCL 5 MG PO TABS: 5.0000 mg | ORAL_TABLET | Freq: Three times a day (TID) | ORAL | Status: DC | PRN

## 2015-10-17 MED ORDER — CYCLOBENZAPRINE HCL 10 MG PO TABS
5.0000 mg | ORAL_TABLET | Freq: Once | ORAL | Status: AC
  Administered 2015-10-17: 5 mg via ORAL
  Filled 2015-10-17: qty 1
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
## 2015-10-21 NOTE — Discharge Instructions (Signed)
Shoulder Pain The shoulder is the joint that connects your arms to your body. The bones that form the shoulder joint include the upper arm bone (humerus), the shoulder blade (scapula), and the collarbone (clavicle). The top of the humerus is shaped like a ball and fits into a rather flat socket on the scapula (glenoid cavity). A combination of muscles and strong, fibrous tissues that connect muscles to bones (tendons) support your shoulder joint and hold the ball in the socket. Small, fluid-filled sacs (bursae) are located in different areas of the joint. They act as cushions between the bones and the overlying soft tissues and help reduce friction between the gliding tendons and the bone as you move your arm. Your shoulder joint allows a wide range of motion in your arm. This range of motion allows you to do things like scratch your back or throw a ball. However, this range of motion also makes your shoulder more prone to pain from overuse and injury. Causes of shoulder pain can originate from both injury and overuse and usually can be grouped in the following four categories:  Redness, swelling, and pain (inflammation) of the tendon (tendinitis) or the bursae (bursitis).  Instability, such as a dislocation of the joint.  Inflammation of the joint (arthritis).  Broken bone (fracture). HOME CARE INSTRUCTIONS   Apply ice to the sore area.  Put ice in a plastic bag.  Place a towel between your skin and the bag.  Leave the ice on for 15-20 minutes, 3-4 times per day for the first 2 days, or as directed by your health care provider.  Stop using cold packs if they do not help with the pain.  If you have a shoulder sling or immobilizer, wear it as long as your caregiver instructs. Only remove it to shower or bathe. Move your arm as little as possible, but keep your hand moving to prevent swelling.  Squeeze a soft ball or foam pad as much as possible to help prevent swelling.  Only take  over-the-counter or prescription medicines for pain, discomfort, or fever as directed by your caregiver. SEEK MEDICAL CARE IF:   Your shoulder pain increases, or new pain develops in your arm, hand, or fingers.  Your hand or fingers become cold and numb.  Your pain is not relieved with medicines. SEEK IMMEDIATE MEDICAL CARE IF:   Your arm, hand, or fingers are numb or tingling.  Your arm, hand, or fingers are significantly swollen or turn white or blue. MAKE SURE YOU:   Understand these instructions.  Will watch your condition.  Will get help right away if you are not doing well or get worse.   This information is not intended to replace advice given to you by your health care provider. Make sure you discuss any questions you have with your health care provider.   Document Released: 09/22/2005 Document Revised: 01/03/2015 Document Reviewed: 04/07/2015 Elsevier Interactive Patient Education 2016 [employer].   Emergency Department Resource Guide 1) Find a Doctor and Pay Out of Pocket Although you won't have to find out who is covered by your insurance plan, it is a good idea to ask around and get recommendations. You will then need to call the office and see if the doctor you have chosen will accept you as a new patient and what types of options they offer for patients who are self-pay. Some doctors offer discounts or will set up payment plans for their patients who do not have insurance, but you  will need to ask so you aren't surprised when you get to your appointment.  2) Contact Your Local Health Department Not all health departments have doctors that can see patients for sick visits, but many do, so it is worth a call to see if yours does. If you don't know where your local health department is, you can check in your phone book. The CDC also has a tool to help you locate your state's health department, and many state websites also have listings of all of their local health  departments.  3) Find a Walk-in Clinic If your illness is not likely to be very severe or complicated, you may want to try a walk in clinic. These are popping up all over the country in pharmacies, drugstores, and shopping centers. They're usually staffed by nurse practitioners or physician assistants that have been trained to treat common illnesses and complaints. They're usually fairly quick and inexpensive. However, if you have serious medical issues or chronic medical problems, these are probably not your best option.  No Primary Care Doctor: - Call Health Connect at  [phone] - they can help you locate a primary care doctor that  accepts your insurance, provides certain services, etc. - Physician Referral Service- [phone]  Chronic Pain Problems: Organization         Address  Phone   Notes  [facility]  [phone] Patients need to be referred by their primary care doctor.   Medication Assistance: Organization         Address  Phone   Notes  [facility] [address] [place], [place] [id number] [phone] --Must be a resident of [place] -- Must have NO insurance coverage whatsoever (no Medicaid/ Medicare, etc.) -- The pt. MUST have a primary care doctor that directs their care regularly and follows them in the community   [facility]  [phone]   [employer]  [phone]    Agencies that provide inexpensive medical care: Organization         Address  Phone   Notes  [facility]  [phone]   [facility]    [phone]   [facility] [address] [place], [place] [address] [phone]   [facility] [address], [place] [phone]   [facility]    [phone]   [facility]    [phone]   [facility]  [address], [place] Phone:  [phone](336)  [phone], Fax:  [phone] Hours of Operation:  9 am - 6 pm, M-F.  Also accepts Medicaid/Medicare and self-pay.  [facility]  [address], [place] Phone: [phone], Fax: [phone]. Hours of Operation:  8:30 am - 5:30 pm, M-F.  Also accepts Medicaid and self-pay.  [facility] [place] [address], [place] Phone: [phone]) [phone]   [facility] [address], [place], [place] [phone], Ext. [phone] Mondays & Thursdays: 7-9 AM.  First 15 patients are seen on a first come, first serve basis.    Medicaid-accepting [facility] Providers:  Organization         Address  Phone   Notes  [facility] [address], [place] [phone] Also accepts self-pay patients.  [facility] [address], [place] [address],  [place] ° [phone]   °[facility] [address], [place] [phone]) [phone]   °[facility] [address] [place] [address], [place] [phone]) [phone]   °Veita Bland [address], [place]  ° [phone] Only accepts [place] Access Medicaid patients after they have their name applied to their card.  ° °Self-Pay (no insurance) in [place]: ° °Organization         Address  Phone   Notes  °Sickle Cell Patients, [facility] [address], [place] [phone]) [phone]   °[facility] [address], [place] [phone]) [phone]   °[facility] ° [address], [place] [phone]) [phone]   °[facility]/Dr. Osei-Bonsu ° [address] [place] [address], [place] or [address], [place] [phone] Phone number for both [place] and [place] locations is the same.  °[facility] [address], [place] [phone]   °[facility] [address] [place] [address], [place] or [address] [phone]  [phone] °([phone]) [phone]   °[facility] [address], [place] ([phone]) [phone], phone; [phone], fax Sees patients 1st and 3rd Saturday of every month.  Must not qualify for public or private insurance (i.e. Medicaid, Medicare, [place] Health Choice, Veterans' Benefits) • Household income should be no more than 200% of the poverty level •The clinic cannot treat you if you are pregnant or think you are pregnant • Sexually transmitted diseases are not treated at the clinic.  ° ° °Dental Care: °Organization         Address  Phone  Notes  °[place] [address], [place] [phone]) [phone] Accepts children up to age 21 who are enrolled in Medicaid or [place] Health Choice; pregnant women with a Medicaid card; and children who have applied for Medicaid or [place] Health Choice, but were declined, whose parents can pay a reduced fee at time of service.  °[place] [facility] [place]  [address], [place] [phone]) [phone] Accepts children up to age 21 who are enrolled in Medicaid or [place] Health Choice; pregnant women with a Medicaid card; and children who have applied for Medicaid or [place] Health Choice, but were declined, whose parents can pay a reduced fee at time of service.  °[facility] ° [address], [place] [phone]) [phone] Patients are seen by appointment only. Walk-ins are not accepted. [facility] will see patients 18 years of age and older. °Monday - Tuesday (8am-5pm) °Most Wednesdays (8:30-5pm) °$30 per visit, cash only  °[facility] ° [address], [place] [phone]) [phone] Patients are seen by appointment only. Walk-ins are not accepted. [facility] will see patients 18 years of age and older. °One Wednesday Evening (Monthly: Volunteer Based).  $30 per visit, cash only  °[facility]  [phone] for adults;  Children under age 4, call Graduate Pediatric Dentistry at [phone]. Children aged 4-14, please call [phone] to request a pediatric application. ° Dental services are provided in all areas of dental care including fillings, crowns and bridges, complete and partial dentures, implants, gum treatment, root canals, and extractions. Preventive care is also provided. Treatment is provided to both adults and children. °Patients are selected via a lottery and   there is often a waiting list. °  °[facility] [address], °[place] ° ([phone]) [phone] [email or web address] °  °[facility] [address], [place], [place] ([phone], Ext. [phone] Second and Fourth Thursday of each month, opens at 6:30 AM; Clinic ends at 9 AM.  Patients are seen on a first-come first-served basis, and a limited number are seen during each clinic.  ° °[facility] ° [address], [place], [place] [phone]) [phone]   Eligibility Requirements °You must have lived in [place], [place], or [place] counties for at least the last three months. °  You cannot be eligible for state or federal sponsored healthcare insurance, including Veterans Administration, Medicaid, or Medicare. °  You generally cannot be eligible for healthcare insurance through your employer.  °  How to apply: °Eligibility screenings are held every Tuesday and Wednesday afternoon from 1:00 pm until 4:00 pm. You do not need an appointment for the interview!  °[facility] [address], [place], [place] [phone]   °[facility]  336-342-8273   °[facility]  336-703-3100   °[facility]  336-570-6415   ° °Behavioral Health Resources in the Community: °Intensive Outpatient Programs °Organization         Address  Phone  Notes  °[facility] [place] [facility] [address], [place], [place] [phone]   °[facility] [address], [place], [place] [phone]   °[facility] [address], [place], [place] ° 336-882-2125   °[facility] [address],  °[place], [place] 1-800-853-5163 or 336-641-4981   °Substance Abuse Resources °Organization         Address  Phone  Notes  °[facility]  336-882-2125   °[facility]  336-784-9470   °[facility]  336-285-9073   °[facility]  336-845-3988   °Residential & Outpatient Substance Abuse Program  1-800-659-3381   °Psychological Services °Organization         Address  Phone  Notes  °[facility]  336- 832-9600   °[facility]  336- 378-7881   °[facility] [address], [place] 1-800-853-5163 or 336-641-4981   ° °Mobile Crisis Teams °Organization         Address  Phone  Notes  °[facility], Mobile Crisis Care Unit  1-877-626-1772   °Assertive °[facility] ° [address] [place], [place] 336-834-9664   °Sharon DeEsch [address] °[place] 336-554-5454   ° °Self-Help/Support Groups °Organization         Address  Phone             Notes  °[facility] [place] - variety of support groups  336- 373-1402 Call for more information  °Narcotics Anonymous (NA), [facility] [address], °[place]  2 meetings at this location  ° °Residential Treatment Programs °Organization         Address  Phone  Notes  °[facility] [address],    °[place]  1-866-801-8205   °[facility] ° [address], [place], [place] 704-293-8524   °[facility] [address], [place] 336-845-3988 Admissions: 8am-3pm M-F  °[facility] [address]    °[place], [place] 336-841-1104   °[facility] [address], [place], [place] 336-379-7146   °  [facility] [address],  [place], [place] [phone]   [facility] - Intensive  Outpatient [address], [place], [place] [phone]   [facility] ([facility] [address] [place]  [place], [place] 2-956-213-0865 or 272-741-6011   [facility] ([facility]) [address], [place], [place] 841-324-4010 Accepts Medicaid  [facility] [address],  [place] 2-725-366-4403 Substance Abuse/Addiction Treatment   [facility] Organization         Address  Phone  Notes  [facility]  539-762-2888   Angie Fava, PhD [address] [place] [place], [place]   854-034-7654 or (548) [phone]   [facility]   [address] [place], [place] ([phone]) [phone]   [facility] [address] [place], [place] [phone] Insurance/Medicaid/sponsorship through [facility] and Families [address] [place] [address]                                    [place], [place] [phone] Therapy/tele-psych/case  [facility] [address], [place] [phone]    Dr. Lolly Mustache  [phone]   [facility]  [employer] [facility] Dept. 1) [address], [place] 2) [address], [place] 3)  [address], [place] [phone]  [phone]   [facility] Child Abuse Hotline 351-288-1858 or 704-368-1191 (After Hours)       Adhesive Capsulitis Adhesive capsulitis is inflammation of the tendons and ligaments that surround the shoulder joint (shoulder capsule). This condition causes the shoulder to become stiff and painful to move. Adhesive capsulitis is also called frozen shoulder. CAUSES This condition may be caused by:  An injury to the shoulder joint.  Straining the shoulder.  Not moving the shoulder for a period of time. This can happen if your arm was injured or in a sling.  Long-standing health problems, such as:  Diabetes.  Thyroid problems.  Heart disease.  Stroke.  Rheumatoid  arthritis.  Lung disease. In some cases, the cause may not be known. RISK FACTORS This condition is more likely to develop in:  Women.  People who are older than 33 years of age. SYMPTOMS Symptoms of this condition include:  Pain in the shoulder when moving the arm. There may also be pain when parts of the shoulder are touched. The pain is worse at night or when at rest.  Soreness or aching in the shoulder.  Inability to move the shoulder normally.  Muscle spasms. DIAGNOSIS This condition is diagnosed with a physical exam and imaging tests, such as an X-ray or MRI. TREATMENT This condition may be treated with:  Treatment of the underlying cause or condition.  Physical therapy. This involves performing exercises to get the shoulder moving again.  Medicine. Medicine may be given to relieve pain, inflammation, or muscle spasms.  Steroid injections into the shoulder joint.  Shoulder manipulation. This is a procedure to move the shoulder into another position. It is done after you are given a medicine to make you fall asleep (general anesthetic). The joint may also be injected with salt water at high pressure to break down scarring.  Surgery. This may be done in severe cases when other treatments have failed. Although most people recover completely from adhesive capsulitis, some may not regain the full  movement of the shoulder. HOME CARE INSTRUCTIONS  Take over-the-counter and prescription medicines only as told by your health care provider.  If you are being treated with physical therapy, follow instructions from your physical therapist.  Avoid exercises that put a lot of demand on your shoulder, such as throwing. These exercises can make pain worse.  If directed, apply ice to the injured area:  Put ice in a plastic bag.  Place a towel between your skin and the bag.  Leave the ice on for 20 minutes, 2-3 times per day. SEEK MEDICAL CARE IF:  You develop new  symptoms.  Your symptoms get worse.   This information is not intended to replace advice given to you by your health care provider. Make sure you discuss any questions you have with your health care provider.   Document Released: 10/10/2009 Document Revised: 09/03/2015 Document Reviewed: 04/07/2015 Elsevier Interactive Patient Education [employer] Inc[employer]

## 2016-11-23 ENCOUNTER — Encounter (HOSPITAL_BASED_OUTPATIENT_CLINIC_OR_DEPARTMENT_OTHER): Payer: Self-pay

## 2016-11-23 ENCOUNTER — Emergency Department (HOSPITAL_BASED_OUTPATIENT_CLINIC_OR_DEPARTMENT_OTHER)
Admission: EM | Admit: 2016-11-23 | Discharge: 2016-11-23 | Disposition: A | Discharge status: Home / Self Care | Payer: Medicaid Other | Attending: Emergency Medicine | Admitting: Emergency Medicine

## 2016-11-23 DIAGNOSIS — B9689 Other specified bacterial agents as the cause of diseases classified elsewhere: Secondary | ICD-10-CM

## 2016-11-23 DIAGNOSIS — N760 Acute vaginitis: Secondary | ICD-10-CM | POA: Insufficient documentation

## 2016-11-23 DIAGNOSIS — I10 Essential (primary) hypertension: Secondary | ICD-10-CM | POA: Insufficient documentation

## 2016-11-23 DIAGNOSIS — N730 Acute parametritis and pelvic cellulitis: Secondary | ICD-10-CM | POA: Insufficient documentation

## 2016-11-23 DIAGNOSIS — F17210 Nicotine dependence, cigarettes, uncomplicated: Secondary | ICD-10-CM | POA: Insufficient documentation

## 2016-11-23 LAB — WET PREP, GENITAL
Sperm: NONE SEEN
TRICH WET PREP: NONE SEEN
Yeast Wet Prep HPF POC: NONE SEEN

## 2016-11-23 LAB — URINALYSIS, ROUTINE W REFLEX MICROSCOPIC
Glucose, UA: NEGATIVE mg/dL
HGB URINE DIPSTICK: NEGATIVE
KETONES UR: NEGATIVE mg/dL
NITRITE: NEGATIVE
PH: 5.5 (ref 5.0–8.0)
Protein, ur: NEGATIVE mg/dL
SPECIFIC GRAVITY, URINE: 1.035 — AB (ref 1.005–1.030)

## 2016-11-23 LAB — PREGNANCY, URINE: Preg Test, Ur: NEGATIVE

## 2016-11-23 LAB — URINE MICROSCOPIC-ADD ON

## 2016-11-23 MED ORDER — CEFTRIAXONE SODIUM 250 MG IJ SOLR
250.0000 mg | Freq: Once | INTRAMUSCULAR | Status: AC
  Administered 2016-11-23: 250 mg via INTRAMUSCULAR
  Filled 2016-11-23: qty 250

## 2016-11-23 MED ORDER — IBUPROFEN 800 MG PO TABS: 800.0000 mg | ORAL_TABLET | Freq: Once | ORAL | Status: DC

## 2016-11-23 MED ORDER — ACETAMINOPHEN 500 MG PO TABS
1000.0000 mg | ORAL_TABLET | Freq: Once | ORAL | Status: AC
  Administered 2016-11-23: 1000 mg via ORAL
  Filled 2016-11-23: qty 2

## 2016-11-23 MED ORDER — LIDOCAINE HCL (PF) 1 % IJ SOLN
INTRAMUSCULAR | Status: AC
  Administered 2016-11-23: 0.9 mL
  Filled 2016-11-23: qty 5

## 2016-11-23 MED ORDER — DOXYCYCLINE HYCLATE 100 MG PO TABS
100.0000 mg | ORAL_TABLET | Freq: Once | ORAL | Status: AC
  Administered 2016-11-23: 100 mg via ORAL
  Filled 2016-11-23: qty 1

## 2016-11-23 MED ORDER — DOXYCYCLINE HYCLATE 100 MG PO CAPS: 100.0000 mg | ORAL_CAPSULE | Freq: Two times a day (BID) | ORAL | 0 refills | Status: DC

## 2016-11-23 MED ORDER — METRONIDAZOLE 0.75 % VA GEL: 1.0000 | Freq: Two times a day (BID) | VAGINAL | 0 refills | Status: DC

## 2016-11-23 MED ORDER — METRONIDAZOLE 500 MG PO TABS: 500.0000 mg | ORAL_TABLET | Freq: Two times a day (BID) | ORAL | 0 refills | Status: DC

## 2016-11-23 NOTE — Discharge Instructions (Signed)
°  [facility] °[email or web address] °[address] °[place], [place] °(336) [phone]  ° ° °[facility] °[email or web address] °[address] °[place], [place] °(336) 378-1110  ° ° °[facility] °[address] °[place], [place] °(336) [phone]  ° °[facility] °[email or web address] °[address] °[place], [place] °(336) [phone]  ° °[facility] °[email or web address] °[address] °[place], [place] °(336) [phone]  ° °[facility] °[email or web address] °[address] °, [place] °(336) [phone] ° °

## 2016-11-23 NOTE — ED Triage Notes (Signed)
Pt c/o lower middle abdominal pain since Wednesday that she states is slightly relieved when she urinates, but it comes right back.  Pt also c/o pain into her back when she is having bowel movements.  She denies fevers, denies discharge, has been unable to relieve pain with ibuprofen.

## 2016-11-23 NOTE — ED Provider Notes (Signed)
TIME SEEN: 1:45 AM  CHIEF COMPLAINT: Lower abdominal pain  HPI: Pt is a 35 y.o. female who presents emergency department with lower abdominal pain that she describes as a cramping for the past week that radiates into her back. Started when she began her menstrual cycle on November 20. Menstrual cycle ended November 25 but symptoms continued. She also reports dysuria and suprapubic pressure. No hematuria, urinary frequency or urgency. No vaginal bleeding or discharge. Denies fevers, chills, nausea, vomiting, diarrhea. No constipation. No history of endometriosis, pregnancy, STD or prior abdominal surgery. She is sexually active with one partner. Last bowel movement was 2 days ago. Was normal-appearing. She has been passing gas. No history of previous UTI but states that she was on the Internet and thought that this was the cause of her symptoms.  ROS: See HPI Constitutional: no fever  Eyes: no drainage  ENT: no runny nose   Cardiovascular:  no chest pain  Resp: no SOB  GI: no vomiting GU: no dysuria Integumentary: no rash  Allergy: no hives  Musculoskeletal: no leg swelling  Neurological: no slurred speech ROS otherwise negative  PAST MEDICAL HISTORY/PAST SURGICAL HISTORY:  Past Medical History:  Diagnosis Date  . Hypertension   . MRSA (methicillin resistant Staphylococcus aureus)     MEDICATIONS:  Prior to Admission medications   Medication Sig Start Date End Date Taking? Authorizing Provider  acetaminophen (TYLENOL) 500 MG tablet Take 1,000 mg by mouth every 6 (six) hours as needed for mild pain.    Historical Provider, MD  cyclobenzaprine (FLEXERIL) 5 MG tablet Take 1 tablet (5 mg total) by mouth 3 (three) times daily as needed for muscle spasms. 10/17/15   Cheri FowlerKayla Rose, PA-C  ibuprofen (ADVIL,MOTRIN) 800 MG tablet Take 1 tablet (800 mg total) by mouth 3 (three) times daily. 10/17/15   Cheri FowlerKayla Rose, PA-C    ALLERGIES:  No Known Allergies  SOCIAL HISTORY:  Social History   Substance Use Topics  . Smoking status: Current Every Day Smoker    Types: Cigarettes  . Smokeless tobacco: Not on file  . Alcohol use Yes     Comment: heavy    FAMILY HISTORY: Family History  Problem Relation Age of Onset  . Hypertension Other   . Diabetes Other   . Coronary artery disease Other     EXAM: BP (!) 136/102 (BP Location: Right Arm)   Pulse 86   Temp 98.6 F (37 C) (Oral)   Resp 18   Ht 5\' 11"  (1.803 m)   Wt 140 lb (63.5 kg)   LMP 11/15/2016   SpO2 98%   BMI 19.53 kg/m  CONSTITUTIONAL: Alert and oriented and responds appropriately to questions. Well-appearing; well-nourished HEAD: Normocephalic EYES: Conjunctivae clear, PERRL, EOMI ENT: normal nose; no rhinorrhea; moist mucous membranes NECK: Supple, no meningismus, no nuchal rigidity, no LAD  CARD: RRR; S1 and S2 appreciated; no murmurs, no clicks, no rubs, no gallops RESP: Normal chest excursion without splinting or tachypnea; breath sounds clear and equal bilaterally; no wheezes, no rhonchi, no rales, no hypoxia or respiratory distress, speaking full sentences ABD/GI: Normal bowel sounds; non-distended; soft, tender throughout the lower abdomen, no tenderness at McBurney's point, no rebound, no guarding, no peritoneal signs, no hepatosplenomegaly BACK:  The back appears normal and is non-tender to palpation, there is no CVA tenderness GU:  Normal external genitalia. No lesions, rashes noted. Patient has no vaginal bleeding on exam. Moderate amount of thin yellow vaginal discharge.  No adnexal tenderness, mass or  fullness.  Pt has moderate cervical motion tenderness. Cervix is not appear friable.  Cervix is closed.  Chaperone present for exam. EXT: Normal ROM in all joints; non-tender to palpation; no edema; normal capillary refill; no cyanosis, no calf tenderness or swelling    SKIN: Normal color for age and race; warm; no rash NEURO: Moves all extremities equally, sensation to light touch intact diffusely,  cranial nerves II through XII intact, normal speech PSYCH: The patient's mood and manner are appropriate. Grooming and personal hygiene are appropriate.  MEDICAL DECISION MAKING: Patient here with lower abdominal pain, cramping. Urine shows small leukocytes and many bacteria but also many squamous cells. Suspect dirty catch. No other sign of infection. Pregnancy test is negative.  Pelvic exam shows moderate amount of yellow discharge coming from the cervical loss. No bleeding. She has no adnexal tenderness or fullness but does have cervical motion tenderness. Concern for possible PID. Will treat with ceftriaxone IM 1 and sent home with 2 weeks of doxycycline. I do not feel she needs an emergent ultrasound. Suspicion for torsion, TOA is low. Doubt appendicitis. Have recommended treating pain with Tylenol and alternating with Motrin. Wet prep shows white blood cells as well as clue cells. We'll also discharge with prescription for Flagyl for the next week.  Have offered HIV and syphilis testing which she declines. Discussed return precautions. Will provide outpatient OB/GYN follow-up. She verbalized understanding is comfortable with this plan.     At this time, I do not feel there is any life-threatening condition present. I have reviewed and discussed all results (EKG, imaging, lab, urine as appropriate) and exam findings with patient/family. I have reviewed nursing notes and appropriate previous records.  I feel the patient is safe to be discharged home without further emergent workup and can continue workup as an outpatient as needed. Discussed usual and customary return precautions. Patient/family verbalize understanding and are comfortable with this plan.  Outpatient follow-up has been provided. All questions have been answered.      Layla MawKristen N Ward, DO 11/23/16 [phone]

## 2016-11-23 NOTE — ED Notes (Signed)
Pt verbalizes understanding of d/c instructions and denies any further needs at this time. 

## 2016-11-24 LAB — GC/CHLAMYDIA PROBE AMP (~~LOC~~) NOT AT ARMC
CHLAMYDIA, DNA PROBE: NEGATIVE
NEISSERIA GONORRHEA: POSITIVE — AB

## 2017-01-07 ENCOUNTER — Emergency Department (HOSPITAL_BASED_OUTPATIENT_CLINIC_OR_DEPARTMENT_OTHER)
Admission: EM | Admit: 2017-01-07 | Discharge: 2017-01-07 | Disposition: A | Discharge status: Home / Self Care | Payer: Medicaid Other | Attending: Emergency Medicine | Admitting: Emergency Medicine

## 2017-01-07 ENCOUNTER — Emergency Department (HOSPITAL_BASED_OUTPATIENT_CLINIC_OR_DEPARTMENT_OTHER): Payer: Medicaid Other

## 2017-01-07 ENCOUNTER — Encounter (HOSPITAL_BASED_OUTPATIENT_CLINIC_OR_DEPARTMENT_OTHER): Payer: Self-pay | Admitting: Emergency Medicine

## 2017-01-07 DIAGNOSIS — R062 Wheezing: Secondary | ICD-10-CM | POA: Insufficient documentation

## 2017-01-07 DIAGNOSIS — M545 Low back pain: Secondary | ICD-10-CM | POA: Insufficient documentation

## 2017-01-07 DIAGNOSIS — R05 Cough: Secondary | ICD-10-CM | POA: Insufficient documentation

## 2017-01-07 DIAGNOSIS — F17210 Nicotine dependence, cigarettes, uncomplicated: Secondary | ICD-10-CM | POA: Insufficient documentation

## 2017-01-07 DIAGNOSIS — I10 Essential (primary) hypertension: Secondary | ICD-10-CM | POA: Insufficient documentation

## 2017-01-07 DIAGNOSIS — R059 Cough, unspecified: Secondary | ICD-10-CM

## 2017-01-07 MED ORDER — ALBUTEROL SULFATE HFA 108 (90 BASE) MCG/ACT IN AERS: 1.0000 | INHALATION_SPRAY | Freq: Four times a day (QID) | RESPIRATORY_TRACT | 0 refills | Status: DC | PRN

## 2017-01-07 MED ORDER — ALBUTEROL SULFATE HFA 108 (90 BASE) MCG/ACT IN AERS
2.0000 | INHALATION_SPRAY | Freq: Once | RESPIRATORY_TRACT | Status: AC
  Administered 2017-01-07: 2 via RESPIRATORY_TRACT
  Filled 2017-01-07: qty 6.7

## 2017-01-07 MED ORDER — BENZONATATE 100 MG PO CAPS: 100.0000 mg | ORAL_CAPSULE | Freq: Three times a day (TID) | ORAL | 0 refills | Status: DC

## 2017-01-07 MED FILL — BENZONATATE 100 MG CAP: 100 | 7 days supply | Qty: 21 | Fill #0

## 2017-01-07 NOTE — Discharge Instructions (Signed)

## 2017-01-07 NOTE — ED Triage Notes (Signed)
Cough x 2 days, taking cough syrup without relief

## 2017-01-07 NOTE — ED Provider Notes (Signed)
Emergency Department Provider Note   I have reviewed the triage vital signs and the nursing notes.   HISTORY  Chief Complaint Cough   HPI Morgan Hammond is a 36 y.o. female with PMH of HTN presents to the emergency department for evaluation of cough, lower back discomfort, and wheezing for the past 2-3 days. The patient has had no chest discomfort. She denies any fever or chills. She has been taking cough syrup but feels that symptoms are worsening. There are multiple sick coworkers with similar symptoms. Denies any vomiting or diarrhea. No headaches. She has been eating and drinking normally. She reports a remote history of asthma when she was a kid no adult symptoms.    Past Medical History:  Diagnosis Date  . Hypertension    occassional, not on meds  . MRSA (methicillin resistant Staphylococcus aureus)     There are no active problems to display for this patient.   Past Surgical History:  Procedure Laterality Date  . INCISION AND DRAINAGE      Current Outpatient Rx  . Order #: [id number] Class: Historical Med  . Order #: [id number] Class: Print  . Order #: [id number] Class: Print  . Order #: [id number] Class: Print  . Order #: [id number] Class: Print  . Order #: [id number] Class: Print  . Order #: [id number] Class: Print  . Order #: [id number] Class: Print    Allergies Patient has no known allergies.  Family History  Problem Relation Age of Onset  . Hypertension Other   . Diabetes Other   . Coronary artery disease Other     Social History Social History  Substance Use Topics  . Smoking status: Current Every Day Smoker    Types: Cigarettes  . Smokeless tobacco: Never Used  . Alcohol use Yes     Comment: heavy    Review of Systems  Constitutional: No fever/chills Eyes: No visual changes. ENT: No sore throat. Cardiovascular: Denies chest pain. Respiratory: Denies shortness of breath. Positive cough.  Gastrointestinal: No abdominal pain.  No nausea, no  vomiting.  No diarrhea.  No constipation. Genitourinary: Negative for dysuria. Musculoskeletal: Positive back pain and generalized muscle aches.  Skin: Negative for rash. Neurological: Negative for headaches, focal weakness or numbness.  10-point ROS otherwise negative.  ____________________________________________   PHYSICAL EXAM:  VITAL SIGNS: ED Triage Vitals  Enc Vitals Group     BP 01/07/17 1448 116/91     Pulse Rate 01/07/17 1448 84     Resp 01/07/17 1448 18     Temp 01/07/17 1448 98 F (36.7 C)     Temp Source 01/07/17 1448 Oral     SpO2 01/07/17 1448 98 %     Weight 01/07/17 1448 140 lb (63.5 kg)     Height 01/07/17 1448 5\' 10"  (1.778 m)     Pain Score 01/07/17 1446 0   Constitutional: Alert and oriented. Well appearing and in no acute distress. Eyes: Conjunctivae are normal. Head: Atraumatic. Nose: No congestion/rhinnorhea. Mouth/Throat: Mucous membranes are moist.  Oropharynx non-erythematous. Neck: No stridor. Cardiovascular: Normal rate, regular rhythm. Good peripheral circulation. Grossly normal heart sounds.   Respiratory: Normal respiratory effort.  No retractions. Lungs with faint expiratory wheezing throughout.  Gastrointestinal: Soft and nontender. No distention.  Musculoskeletal: No lower extremity tenderness nor edema. No gross deformities of extremities. Neurologic:  Normal speech and language. No gross focal neurologic deficits are appreciated.  Skin:  Skin is warm, dry and intact. No rash noted. ____________________________________________  RADIOLOGY  Dg Chest 2 View  Result Date:  01/07/2017 CLINICAL DATA:  Productive cough with yellow sputum for 2 days EXAM: CHEST  2 VIEW COMPARISON:  10/17/2015 FINDINGS: The heart size and mediastinal contours are within normal limits. Both lungs are clear. The visualized skeletal structures are unremarkable. IMPRESSION: No active cardiopulmonary disease. Electronically Signed   By: Jasmine PangKim  Fujinaga M.D.   On:  01/07/2017 15:03    ____________________________________________   PROCEDURES  Procedure(s) performed:   Procedures  None ____________________________________________   INITIAL IMPRESSION / ASSESSMENT AND PLAN / ED COURSE  Pertinent labs & imaging results that were available during my care of the patient were reviewed by me and considered in my medical decision making (see chart for details).  Patient presents to the emergency department for evaluation of cough and URI symptoms for the past 2-3 days. The patient has very faint expiratory wheezing on my exam. X-ray performed in triage shows no infiltrate. She is well appearing. Plan for albuterol inhaler to use at home as needed along with Jerilynn Somessalon Perles and Tylenol/Motrin. Discussed good hydration return precautions.   At this time, I do not feel there is any life-threatening condition present. I have reviewed and discussed all results (EKG, imaging, lab, urine as appropriate), exam findings with patient. I have reviewed nursing notes and appropriate previous records.  I feel the patient is safe to be discharged home without further emergent workup. Discussed usual and customary return precautions. Patient and family (if present) verbalize understanding and are comfortable with this plan.  Patient will follow-up with their primary care provider. If they do not have a primary care provider, information for follow-up has been provided to them. All questions have been answered.  ____________________________________________  FINAL CLINICAL IMPRESSION(S) / ED DIAGNOSES  Final diagnoses:  Cough     MEDICATIONS GIVEN DURING THIS VISIT:  Medications  albuterol (PROVENTIL HFA;VENTOLIN HFA) 108 (90 Base) MCG/ACT inhaler 2 puff (not administered)     NEW OUTPATIENT MEDICATIONS STARTED DURING THIS VISIT:  New Prescriptions   ALBUTEROL (PROVENTIL HFA;VENTOLIN HFA) 108 (90 BASE) MCG/ACT INHALER    Inhale 1-2 puffs into the lungs every  6 (six) hours as needed for wheezing or shortness of breath.   BENZONATATE (TESSALON) 100 MG CAPSULE    Take 1 capsule (100 mg total) by mouth every 8 (eight) hours.      Note:  This document was prepared using Dragon voice recognition software and may include unintentional dictation errors.  Alona BeneJoshua Indi Willhite, MD Emergency Medicine   Maia PlanJoshua G Clark Cuff, MD 01/08/17 [phone]

## 2019-01-05 ENCOUNTER — Encounter (HOSPITAL_BASED_OUTPATIENT_CLINIC_OR_DEPARTMENT_OTHER): Payer: Self-pay | Admitting: *Deleted

## 2019-01-05 ENCOUNTER — Emergency Department (HOSPITAL_BASED_OUTPATIENT_CLINIC_OR_DEPARTMENT_OTHER)
Admission: EM | Admit: 2019-01-05 | Discharge: 2019-01-05 | Disposition: A | Discharge status: Home / Self Care | Payer: BLUE CROSS/BLUE SHIELD | Attending: Emergency Medicine | Admitting: Emergency Medicine

## 2019-01-05 ENCOUNTER — Other Ambulatory Visit: Payer: Self-pay

## 2019-01-05 DIAGNOSIS — F17210 Nicotine dependence, cigarettes, uncomplicated: Secondary | ICD-10-CM | POA: Insufficient documentation

## 2019-01-05 DIAGNOSIS — I10 Essential (primary) hypertension: Secondary | ICD-10-CM | POA: Insufficient documentation

## 2019-01-05 DIAGNOSIS — H1031 Unspecified acute conjunctivitis, right eye: Secondary | ICD-10-CM | POA: Diagnosis not present

## 2019-01-05 DIAGNOSIS — R69 Illness, unspecified: Secondary | ICD-10-CM

## 2019-01-05 DIAGNOSIS — R0981 Nasal congestion: Secondary | ICD-10-CM | POA: Diagnosis not present

## 2019-01-05 DIAGNOSIS — J111 Influenza due to unidentified influenza virus with other respiratory manifestations: Secondary | ICD-10-CM

## 2019-01-05 DIAGNOSIS — H10029 Other mucopurulent conjunctivitis, unspecified eye: Secondary | ICD-10-CM | POA: Diagnosis present

## 2019-01-05 MED ORDER — POLYMYXIN B-TRIMETHOPRIM 10000-0.1 UNIT/ML-% OP SOLN: 1.0000 [drp] | OPHTHALMIC | 0 refills | Status: DC

## 2019-01-05 MED ORDER — OSELTAMIVIR PHOSPHATE 75 MG PO CAPS: 75.0000 mg | ORAL_CAPSULE | Freq: Two times a day (BID) | ORAL | 0 refills | Status: AC

## 2019-01-05 MED FILL — OSELTAMIVIR PHOSPHATE 75 MG: 75 | 5 days supply | Qty: 10 | Fill #0

## 2019-01-05 MED FILL — POLYMYXIN B/TMP EYE DROPS: 10000-0.1 | 30 days supply | Qty: 10 | Fill #0

## 2019-01-05 NOTE — ED Triage Notes (Signed)
Pt woke up this morning with her right eye closed from "gooy stuff:" has had congestion about a week and has seen the dentist on Thursday, not feeling well since.

## 2019-01-05 NOTE — Discharge Instructions (Addendum)
Evaluated today for influenza-like symptoms as well as conjunctivitis.  I have given you a prescription for eye ointment for your right eye.  Please follow directions on prescriptions for usage.  Please to try and not scratch the right eye in the left eye or you may spread this.  I have given you Tamiflu for influenza.  If you develop nausea, vomiting, diarrhea or severe headache please stop taking this.  You may also take Tylenol or ibuprofen as needed for your symptoms.  Follow-up with PCP for reevaluation the next week if you continue to have symptoms.  Return turn to the ED for any worsening symptoms.

## 2019-01-05 NOTE — ED Provider Notes (Signed)
[facility] EMERGENCY DEPARTMENT Provider Note   CSN: [id number] Arrival date & time: 01/05/19  1056   History   Chief Complaint Chief Complaint  Patient presents with  . Conjunctivitis  . Nasal Congestion    HPI Morgan Hammond is a 38 y.o. female with past medical history significant for hypertension who presents for evaluation of eye discharge as well as nasal congestion and cough.  Patient states she woke up this morning with yellow discharge from her eye.  States that she had to take a warm damp rag to her eye in order to open it.  Patient states she was exposed to an infant who had similar symptoms approximately 4 days ago.  Patient states she has had itchiness and wateryness to her right eye.  No symptoms in her left eye.  Patient states she has also had nasal congestion, body aches and pains, nonproductive cough x2 days.  Did not receive influenza vaccine.  Patient states she has felt warm, however has not taken her temperature.  Has not taken anything for symptoms PTA.  Patient states that her body hurts all over.  Denies additional aggravating or alleviating factors.    History provided by patient.  No interpreter was used.  HPI  Past Medical History:  Diagnosis Date  . Hypertension    occassional, not on meds  . MRSA (methicillin resistant Staphylococcus aureus)     There are no active problems to display for this patient.   Past Surgical History:  Procedure Laterality Date  . INCISION AND DRAINAGE       OB History   No obstetric history on file.      Home Medications    Prior to Admission medications   Medication Sig Start Date End Date Taking? Authorizing Provider  acetaminophen (TYLENOL) 500 MG tablet Take 1,000 mg by mouth every 6 (six) hours as needed for mild pain.    [provider]  albuterol (PROVENTIL HFA;VENTOLIN HFA) 108 (90 Base) MCG/ACT inhaler Inhale 1-2 puffs into the lungs every 6 (six) hours as needed for wheezing or  shortness of breath. 01/07/17   Long, Arlyss Repress, MD  benzonatate (TESSALON) 100 MG capsule Take 1 capsule (100 mg total) by mouth every 8 (eight) hours. 01/07/17   Long, Arlyss Repress, MD  cyclobenzaprine (FLEXERIL) 5 MG tablet Take 1 tablet (5 mg total) by mouth 3 (three) times daily as needed for muscle spasms. 10/17/15   Cheri Fowler, PA-C  doxycycline (VIBRAMYCIN) 100 MG capsule Take 1 capsule (100 mg total) by mouth 2 (two) times daily. 11/23/16   Ward, Layla Maw, DO  ibuprofen (ADVIL,MOTRIN) 800 MG tablet Take 1 tablet (800 mg total) by mouth 3 (three) times daily. 10/17/15   Cheri Fowler, PA-C  metroNIDAZOLE (FLAGYL) 500 MG tablet Take 1 tablet (500 mg total) by mouth 2 (two) times daily. 11/23/16   Ward, Layla Maw, DO  metroNIDAZOLE (METROGEL VAGINAL) 0.75 % vaginal gel Place 1 Applicatorful vaginally 2 (two) times daily. For 5 days 11/23/16   Ward, Layla Maw, DO  oseltamivir (TAMIFLU) 75 MG capsule Take 1 capsule (75 mg total) by mouth 2 (two) times daily for 5 days. 01/05/19 01/10/19  Irish Piech A, PA-C  trimethoprim-polymyxin b (POLYTRIM) ophthalmic solution Place 1 drop into the right eye every 4 (four) hours. 01/05/19   Sharna Gabrys A, PA-C    Family History Family History  Problem Relation Age of Onset  . Hypertension Other   . Diabetes Other   . Coronary  artery disease Other     Social History Social History   Tobacco Use  . Smoking status: Current Every Day Smoker    Types: Cigarettes  . Smokeless tobacco: Never Used  Substance Use Topics  . Alcohol use: Yes    Comment: heavy  . Drug use: No    Comment: former     Allergies   Patient has no known allergies.   Review of Systems Review of Systems  Constitutional:       Subjective fever.  HENT: Positive for congestion, postnasal drip and rhinorrhea. Negative for ear discharge, ear pain, facial swelling, nosebleeds, sinus pressure, sinus pain, sneezing, sore throat, tinnitus, trouble swallowing and voice change.     Eyes: Positive for discharge. Negative for photophobia and visual disturbance.  Respiratory: Positive for cough. Negative for choking, chest tightness, shortness of breath, wheezing and stridor.   Cardiovascular: Negative.   Gastrointestinal: Negative.   Genitourinary: Negative.   Musculoskeletal: Negative.   Skin: Negative.   Neurological: Negative.   All other systems reviewed and are negative.    Physical Exam Updated Vital Signs BP 122/80 (BP Location: Left Arm)   Pulse 95   Temp 98.2 F (36.8 C) (Oral)   Resp 18   Ht 5' 10.5" (1.791 m)   Wt 65.8 kg   LMP 12/18/2018 (Exact Date)   SpO2 100%   BMI 20.51 kg/m   Physical Exam Vitals signs and nursing note reviewed.  Constitutional:      General: She is not in acute distress.    Appearance: She is well-developed. She is not ill-appearing, toxic-appearing or diaphoretic.  HENT:     Head: Normocephalic and atraumatic.     Right Ear: Tympanic membrane, ear canal and external ear normal. There is no impacted cerumen.     Left Ear: Tympanic membrane, ear canal and external ear normal. There is no impacted cerumen.     Nose: Congestion and rhinorrhea present.     Mouth/Throat:     Mouth: Mucous membranes are moist.     Pharynx: Oropharynx is clear.  Eyes:     General: Lids are normal. Lids are everted, no foreign bodies appreciated. Vision grossly intact. No allergic shiner, visual field deficit or scleral icterus.       Right eye: Discharge present. No foreign body or hordeolum.        Left eye: No foreign body, discharge or hordeolum.     Extraocular Movements: Extraocular movements intact.     Right eye: No nystagmus.     Left eye: No nystagmus.     Conjunctiva/sclera: Conjunctivae normal.     Right eye: Right conjunctiva is not injected. No hemorrhage.    Left eye: Left conjunctiva is not injected. No hemorrhage.    Pupils: Pupils are equal, round, and reactive to light.  Neck:     Musculoskeletal: Normal range of  motion.     Comments: No stiffness or rigidity. Cardiovascular:     Rate and Rhythm: Normal rate.     Pulses: Normal pulses.     Heart sounds: Normal heart sounds. No murmur. No gallop.   Pulmonary:     Effort: No respiratory distress.     Comments: Clear to auscultation bilaterally without wheeze, rhonchi or rales. Abdominal:     General: There is no distension.     Comments: Soft, nontender without rebound or guarding.  Musculoskeletal: Normal range of motion.     Comments: Moves all extremities without difficulty.  Skin:  General: Skin is warm and dry.     Comments: No rashes.  Neurological:     Mental Status: She is alert.      ED Treatments / Results  Labs (all labs ordered are listed, but only abnormal results are displayed) Labs Reviewed - No data to display  EKG None  Radiology No results found.  Procedures Procedures (including critical care time)  Medications Ordered in ED Medications - No data to display   Initial Impression / Assessment and Plan / ED Course  I have reviewed the triage vital signs and the nursing notes.  Pertinent labs & imaging results that were available during my care of the patient were reviewed by me and considered in my medical decision making (see chart for details).  38 year old female who appears otherwise well presents for evaluation of right eye discharge as well as flulike symptoms.  Afebrile, nonseptic, non-ill-appearing.  Flu symptoms began 2 days ago.  Did not receive influenza vaccine.  Lungs clear to auscultation bilaterally without wheeze, rhonchi or rales, low suspicion for pneumonia.  Right eye with purulent drainage.  Patient refuses fluorescein stain of eye.  Discussed risk vs benefit of fluorescein stain.  Patient voices understanding and does not wish to proceed with forcing staining of her eye.  Presentation non-concerning for iritis,  corneal abrasions, or HSV.  Will dc with ABx for bacterial conjunctivitis.  Personal hygiene and frequent handwashing discussed.  Patient advised to followup with ophthalmologist if symptoms persist or worsen in any way including vision change .    Patient with symptoms consistent with influenza.  Vitals are stable.  No signs of dehydration, tolerating PO's.  Lungs are clear. Due to patient's presentation and physical exam a chest x-ray was not ordered bc likely diagnosis of flu.  Discussed the cost vs benefit of Tamiflu treatment with the patient.  Requesting Tamiflu prescription at this time.  Patient will be discharged with instructions to orally hydrate, rest, and use over-the-counter medications such as anti-inflammatories ibuprofen and Aleve for muscle aches and Tylenol for fever.  She is hemodynamically stable and appropriate for DC home at this time.  Low suspicion for emergent pathology causing patient's symptoms at this time.  Discussed strict return precautions.  Patient voiced understanding of return precautions and is agreeable for follow-up.     Final Clinical Impressions(s) / ED Diagnoses   Final diagnoses:  Acute conjunctivitis of right eye, unspecified acute conjunctivitis type  Influenza-like illness    ED Discharge Orders         Ordered    oseltamivir (TAMIFLU) 75 MG capsule  2 times daily     01/05/19 1215    trimethoprim-polymyxin b (POLYTRIM) ophthalmic solution  Every 4 hours     01/05/19 1215           Abbas Beyene A, PA-C 01/05/19 1858    Vanetta MuldersZackowski, Scott, MD 01/16/19 [phone]

## 2019-03-21 ENCOUNTER — Emergency Department (HOSPITAL_BASED_OUTPATIENT_CLINIC_OR_DEPARTMENT_OTHER): Payer: BLUE CROSS/BLUE SHIELD

## 2019-03-21 ENCOUNTER — Emergency Department (HOSPITAL_BASED_OUTPATIENT_CLINIC_OR_DEPARTMENT_OTHER)
Admission: EM | Admit: 2019-03-21 | Discharge: 2019-03-21 | Disposition: A | Discharge status: Home / Self Care | Payer: BLUE CROSS/BLUE SHIELD | Attending: Emergency Medicine | Admitting: Emergency Medicine

## 2019-03-21 ENCOUNTER — Encounter (HOSPITAL_BASED_OUTPATIENT_CLINIC_OR_DEPARTMENT_OTHER): Payer: Self-pay | Admitting: *Deleted

## 2019-03-21 ENCOUNTER — Other Ambulatory Visit: Payer: Self-pay

## 2019-03-21 DIAGNOSIS — R0789 Other chest pain: Secondary | ICD-10-CM | POA: Insufficient documentation

## 2019-03-21 DIAGNOSIS — F17210 Nicotine dependence, cigarettes, uncomplicated: Secondary | ICD-10-CM | POA: Diagnosis not present

## 2019-03-21 DIAGNOSIS — I10 Essential (primary) hypertension: Secondary | ICD-10-CM | POA: Diagnosis not present

## 2019-03-21 DIAGNOSIS — M25511 Pain in right shoulder: Secondary | ICD-10-CM | POA: Insufficient documentation

## 2019-03-21 DIAGNOSIS — M79642 Pain in left hand: Secondary | ICD-10-CM | POA: Insufficient documentation

## 2019-03-21 NOTE — ED Notes (Signed)
Patient transported to X-ray 

## 2019-03-21 NOTE — Discharge Instructions (Addendum)
There were no acute abnormalities on the x-rays, including no sign of fracture or dislocation, however, there could be injuries to the soft tissues, such as the ligaments or tendons that are not seen on xrays. There could also be what are called occult fractures that are small fractures not seen on xray. Antiinflammatory medications: Take 600 mg of ibuprofen every 6 hours or 440 mg (over the counter dose) to 500 mg (prescription dose) of naproxen every 12 hours for the next 3 days. After this time, these medications may be used as needed for pain. Take these medications with food to avoid upset stomach. Choose only one of these medications, do not take them together. Acetaminophen (generic for Tylenol): Should you continue to have additional pain while taking the ibuprofen or naproxen, you may add in acetaminophen as needed. Your daily total maximum amount of acetaminophen from all sources should be limited to 4000mg /day for persons without liver problems, or 2000mg /day for those with liver problems. Ice: May apply ice to the area over the next 24 hours for 15 minutes at a time to reduce swelling. Elevation: Keep the extremity elevated as often as possible to reduce pain and inflammation. Exercises: Start by performing these exercises a few times a week, increasing the frequency until you are performing them twice daily.  Follow up: If symptoms are improving, you may follow up with your primary care provider for any continued management. If symptoms are not starting to improve within a week, you should follow up with the orthopedic specialist within two weeks. Return: Return to the ED for numbness, weakness, increasing pain, overall worsening symptoms, loss of function, or if symptoms are not improving, you have tried to follow up with the orthopedic specialist, and have been unable to do so.  For prescription assistance, may try using prescription discount sites or apps, such as goodrx.com
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
## 2019-03-21 NOTE — ED Notes (Signed)
ED Provider at bedside. 
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
## 2019-03-30 ENCOUNTER — Emergency Department (HOSPITAL_BASED_OUTPATIENT_CLINIC_OR_DEPARTMENT_OTHER)
Admission: EM | Admit: 2019-03-30 | Discharge: 2019-03-30 | Disposition: A | Discharge status: Home / Self Care | Payer: BLUE CROSS/BLUE SHIELD | Attending: Emergency Medicine | Admitting: Emergency Medicine

## 2019-03-30 ENCOUNTER — Emergency Department (HOSPITAL_BASED_OUTPATIENT_CLINIC_OR_DEPARTMENT_OTHER): Payer: BLUE CROSS/BLUE SHIELD

## 2019-03-30 ENCOUNTER — Other Ambulatory Visit: Payer: Self-pay

## 2019-03-30 ENCOUNTER — Encounter (HOSPITAL_BASED_OUTPATIENT_CLINIC_OR_DEPARTMENT_OTHER): Payer: Self-pay | Admitting: *Deleted

## 2019-03-30 DIAGNOSIS — J069 Acute upper respiratory infection, unspecified: Secondary | ICD-10-CM | POA: Insufficient documentation

## 2019-03-30 DIAGNOSIS — Z79899 Other long term (current) drug therapy: Secondary | ICD-10-CM | POA: Diagnosis not present

## 2019-03-30 DIAGNOSIS — I10 Essential (primary) hypertension: Secondary | ICD-10-CM | POA: Insufficient documentation

## 2019-03-30 DIAGNOSIS — R05 Cough: Secondary | ICD-10-CM | POA: Diagnosis present

## 2019-03-30 DIAGNOSIS — F17210 Nicotine dependence, cigarettes, uncomplicated: Secondary | ICD-10-CM | POA: Insufficient documentation

## 2019-03-30 MED ORDER — FLUTICASONE PROPIONATE 50 MCG/ACT NA SUSP: 2.0000 | Freq: Every day | NASAL | 0 refills | Status: DC

## 2019-03-30 MED ORDER — ALBUTEROL SULFATE HFA 108 (90 BASE) MCG/ACT IN AERS
2.0000 | INHALATION_SPRAY | Freq: Once | RESPIRATORY_TRACT | Status: AC
  Administered 2019-03-30: 2 via RESPIRATORY_TRACT
  Filled 2019-03-30: qty 6.7

## 2019-03-30 MED FILL — FLUTICASONE PROP 50 MCG SPR: 50 | 30 days supply | Qty: 16 | Fill #0

## 2019-03-30 NOTE — ED Provider Notes (Signed)
[facility] EMERGENCY DEPARTMENT Provider Note   CSN: [id number] Arrival date & time: 03/30/19  1413    History   Chief Complaint Chief Complaint  Patient presents with  . Cough  . Nasal Congestion    HPI Morgan Hammond is a 38 y.o. female.     HPI  Pt is a 38 y/o female with a h/o HTN, who presents to the ED today c/o productive cough with yellow/green sputum, rhinorrhea, nasal congestion, sore throat. No known fevers. No sob or chest pain.   No abd pain, diarrhea or vomiting. Mildly nauseated.   No known covid exposures. No recent foreign travel. No sick contact.  Past Medical History:  Diagnosis Date  . Hypertension    occassional, not on meds  . MRSA (methicillin resistant Staphylococcus aureus)     There are no active problems to display for this patient.   Past Surgical History:  Procedure Laterality Date  . INCISION AND DRAINAGE       OB History   No obstetric history on file.      Home Medications    Prior to Admission medications   Medication Sig Start Date End Date Taking? Authorizing Provider  acetaminophen (TYLENOL) 500 MG tablet Take 1,000 mg by mouth every 6 (six) hours as needed for mild pain.   Yes [provider]  albuterol (PROVENTIL HFA;VENTOLIN HFA) 108 (90 Base) MCG/ACT inhaler Inhale 1-2 puffs into the lungs every 6 (six) hours as needed for wheezing or shortness of breath. 01/07/17  Yes Long, Arlyss Repress, MD  benzonatate (TESSALON) 100 MG capsule Take 1 capsule (100 mg total) by mouth every 8 (eight) hours. 01/07/17   Long, Arlyss Repress, MD  cyclobenzaprine (FLEXERIL) 5 MG tablet Take 1 tablet (5 mg total) by mouth 3 (three) times daily as needed for muscle spasms. 10/17/15   Cheri Fowler, PA-C  doxycycline (VIBRAMYCIN) 100 MG capsule Take 1 capsule (100 mg total) by mouth 2 (two) times daily. 11/23/16   Ward, Layla Maw, DO  fluticasone (FLONASE) 50 MCG/ACT nasal spray Place 2 sprays into both nostrils daily. 03/30/19    Arlissa Monteverde S, PA-C  ibuprofen (ADVIL,MOTRIN) 800 MG tablet Take 1 tablet (800 mg total) by mouth 3 (three) times daily. 10/17/15   Cheri Fowler, PA-C  metroNIDAZOLE (FLAGYL) 500 MG tablet Take 1 tablet (500 mg total) by mouth 2 (two) times daily. 11/23/16   Ward, Layla Maw, DO  metroNIDAZOLE (METROGEL VAGINAL) 0.75 % vaginal gel Place 1 Applicatorful vaginally 2 (two) times daily. For 5 days 11/23/16   Ward, Layla Maw, DO  trimethoprim-polymyxin b (POLYTRIM) ophthalmic solution Place 1 drop into the right eye every 4 (four) hours. 01/05/19   Henderly, Britni A, PA-C    Family History Family History  Problem Relation Age of Onset  . Hypertension Other   . Diabetes Other   . Coronary artery disease Other     Social History Social History   Tobacco Use  . Smoking status: Current Every Day Smoker    Types: Cigarettes  . Smokeless tobacco: Never Used  Substance Use Topics  . Alcohol use: Yes    Comment: heavy  . Drug use: No    Comment: former     Allergies   Patient has no known allergies.   Review of Systems Review of Systems  Constitutional: Negative for fever.  HENT: Positive for congestion, rhinorrhea and sore throat.   Eyes: Negative for visual disturbance.  Respiratory: Positive for cough. Negative for shortness  of breath.   Cardiovascular: Negative for chest pain.  Gastrointestinal: Negative for abdominal pain, constipation, diarrhea and vomiting.  Genitourinary: Negative for pelvic pain.  Musculoskeletal: Negative for back pain.  Skin: Negative for rash.  Neurological: Negative for headaches.    Physical Exam Updated Vital Signs BP (!) 150/112   Pulse 72   Temp 98.5 F (36.9 C) (Oral)   Resp 18   Ht 5' 10.5" (1.791 m)   Wt 61.3 kg   LMP 03/19/2019   SpO2 100%   BMI 19.12 kg/m   Physical Exam Vitals signs and nursing note reviewed.  Constitutional:      General: She is not in acute distress.    Appearance: She is well-developed. She is not  ill-appearing or toxic-appearing.  HENT:     Head: Normocephalic and atraumatic.     Nose: Nose normal. No congestion.     Mouth/Throat:     Pharynx: No oropharyngeal exudate or posterior oropharyngeal erythema.  Eyes:     Conjunctiva/sclera: Conjunctivae normal.  Neck:     Musculoskeletal: Neck supple.  Cardiovascular:     Rate and Rhythm: Normal rate and regular rhythm.     Heart sounds: No murmur.  Pulmonary:     Effort: Pulmonary effort is normal. No respiratory distress.     Breath sounds: Normal breath sounds. No wheezing, rhonchi or rales.     Comments: Dry cough on exam Abdominal:     General: Bowel sounds are normal.     Palpations: Abdomen is soft.     Tenderness: There is no abdominal tenderness.  Skin:    General: Skin is warm and dry.  Neurological:     Mental Status: She is alert.      ED Treatments / Results  Labs (all labs ordered are listed, but only abnormal results are displayed) Labs Reviewed - No data to display  EKG None  Radiology Dg Chest Portable 1 View  Result Date: 03/30/2019 CLINICAL DATA:  Cough and shortness of breath for 2 days EXAM: PORTABLE CHEST 1 VIEW COMPARISON:  03/21/2019 FINDINGS: Cardiac shadows within normal limits. The lungs are well aerated bilaterally. No focal infiltrate or sizable effusion is seen. No bony abnormality is noted. IMPRESSION: No acute abnormality noted. Electronically Signed   By: Alcide Clever M.D.   On: 03/30/2019 15:40    Procedures Procedures (including critical care time)  Medications Ordered in ED Medications  albuterol (PROVENTIL HFA;VENTOLIN HFA) 108 (90 Base) MCG/ACT inhaler 2 puff (has no administration in time range)     Initial Impression / Assessment and Plan / ED Course  I have reviewed the triage vital signs and the nursing notes.  Pertinent labs & imaging results that were available during my care of the patient were reviewed by me and considered in my medical decision making (see chart  for details).     Final Clinical Impressions(s) / ED Diagnoses   Final diagnoses:  Upper respiratory tract infection, unspecified type   Pt with uri sxs x 3 days. Out of window for tamiflu therefore flu testing deferred. Pt does not meet testing criteria for COVID. Pt CXR negative for acute infiltrate. Patients symptoms are consistent with URI, likely viral etiology. In light of COVID pandemic, advised self quarantine. Discussed that antibiotics are not indicated for viral infections. Pt will be discharged with symptomatic treatment.  Verbalizes understanding and is agreeable with plan. Pt is hemodynamically stable & in NAD prior to dc.  Anayely Defrain was evaluated in Emergency  Department on 03/30/2019 for the symptoms described in the history of present illness. She was evaluated in the context of the global COVID-19 pandemic, which necessitated consideration that the patient might be at risk for infection with the SARS-CoV-2 virus that causes COVID-19. Institutional protocols and algorithms that pertain to the evaluation of patients at risk for COVID-19 are in a state of rapid change based on information released by regulatory bodies including the CDC and federal and state organizations. These policies and algorithms were followed during the patient's care in the ED.   ED Discharge Orders         Ordered    fluticasone [facility]) 50 MCG/ACT nasal spray  Daily     03/30/19 [address] PA-C 03/30/19 1556    Azalia Bilis, MD 04/02/19 [phone]

## 2019-03-30 NOTE — ED Triage Notes (Signed)
Cough and SOB x 2 days. Laryngitis. No fever.

## 2019-03-30 NOTE — Discharge Instructions (Addendum)
Control Measures  Patients who have symptoms consistent with COVID-19 should self-isolate for:  -At least 3 days (72 hours) have passed since recovery defined as resolution of fever without the use of fever-reducing medications and improvement in respiratory symptoms (e.g., cough, shortness of breath)  AND  -At least 7 days have passed since symptoms first appeared.  Close contacts of a person with known or suspected COVID-19 should self-monitor their temperature and symptoms of COVID-19, limit outside interaction as much as possible for 14 days, and self-isolate if they develop symptoms.   Please follow up with your primary care provider within 5-7 days for re-evaluation of your symptoms. If you do not have a primary care provider, information for a healthcare clinic has been provided for you to make arrangements for follow up care. Please return to the emergency department for any new or worsening symptoms.

## 2019-11-05 ENCOUNTER — Other Ambulatory Visit: Payer: Self-pay

## 2019-11-05 ENCOUNTER — Emergency Department (HOSPITAL_BASED_OUTPATIENT_CLINIC_OR_DEPARTMENT_OTHER): Payer: Self-pay

## 2019-11-05 ENCOUNTER — Emergency Department (HOSPITAL_BASED_OUTPATIENT_CLINIC_OR_DEPARTMENT_OTHER)
Admission: EM | Admit: 2019-11-05 | Discharge: 2019-11-05 | Disposition: A | Discharge status: Home / Self Care | Payer: Self-pay | Attending: Emergency Medicine | Admitting: Emergency Medicine

## 2019-11-05 ENCOUNTER — Encounter (HOSPITAL_BASED_OUTPATIENT_CLINIC_OR_DEPARTMENT_OTHER): Payer: Self-pay

## 2019-11-05 DIAGNOSIS — N739 Female pelvic inflammatory disease, unspecified: Secondary | ICD-10-CM | POA: Insufficient documentation

## 2019-11-05 DIAGNOSIS — N730 Acute parametritis and pelvic cellulitis: Secondary | ICD-10-CM

## 2019-11-05 DIAGNOSIS — Z8614 Personal history of Methicillin resistant Staphylococcus aureus infection: Secondary | ICD-10-CM | POA: Insufficient documentation

## 2019-11-05 DIAGNOSIS — F17210 Nicotine dependence, cigarettes, uncomplicated: Secondary | ICD-10-CM | POA: Insufficient documentation

## 2019-11-05 DIAGNOSIS — I10 Essential (primary) hypertension: Secondary | ICD-10-CM | POA: Insufficient documentation

## 2019-11-05 LAB — URINALYSIS, ROUTINE W REFLEX MICROSCOPIC
Glucose, UA: NEGATIVE mg/dL
Ketones, ur: NEGATIVE mg/dL
Nitrite: NEGATIVE
Protein, ur: 30 mg/dL — AB
Specific Gravity, Urine: >1.03 — ABNORMAL HIGH (ref 1.005–1.030)
pH: 5.5 (ref 5.0–8.0)

## 2019-11-05 LAB — URINALYSIS, MICROSCOPIC (REFLEX)

## 2019-11-05 LAB — CBC WITH DIFFERENTIAL/PLATELET
Abs Immature Granulocytes: 0.06 10*3/uL (ref 0.00–0.07)
Basophils Absolute: 0 10*3/uL (ref 0.0–0.1)
Basophils Relative: 0 %
Eosinophils Absolute: 0 10*3/uL (ref 0.0–0.5)
Eosinophils Relative: 0 %
HCT: 41.4 % (ref 36.0–46.0)
Hemoglobin: 13.7 g/dL (ref 12.0–15.0)
Immature Granulocytes: 0 %
Lymphocytes Relative: 9 %
Lymphs Abs: 1.3 10*3/uL (ref 0.7–4.0)
MCH: 32.5 pg (ref 26.0–34.0)
MCHC: 33.1 g/dL (ref 30.0–36.0)
MCV: 98.3 fL (ref 80.0–100.0)
Monocytes Absolute: 0.5 10*3/uL (ref 0.1–1.0)
Monocytes Relative: 3 %
Neutro Abs: 12.7 10*3/uL — ABNORMAL HIGH (ref 1.7–7.7)
Neutrophils Relative %: 88 %
Platelets: 203 10*3/uL (ref 150–400)
RBC: 4.21 MIL/uL (ref 3.87–5.11)
RDW: 13.2 % (ref 11.5–15.5)
WBC: 14.6 10*3/uL — ABNORMAL HIGH (ref 4.0–10.5)
nRBC: 0 % (ref 0.0–0.2)

## 2019-11-05 LAB — BASIC METABOLIC PANEL
Anion gap: 10 (ref 5–15)
BUN: 9 mg/dL (ref 6–20)
CO2: 22 mmol/L (ref 22–32)
Calcium: 9.3 mg/dL (ref 8.9–10.3)
Chloride: 103 mmol/L (ref 98–111)
Creatinine, Ser: 1.13 mg/dL — ABNORMAL HIGH (ref 0.44–1.00)
GFR calc Af Amer: >60 mL/min (ref >60)
GFR calc non Af Amer: >60 mL/min (ref >60)
Glucose, Bld: 91 mg/dL (ref 70–99)
Potassium: 3.4 mmol/L — ABNORMAL LOW (ref 3.5–5.1)
Sodium: 135 mmol/L (ref 135–145)

## 2019-11-05 LAB — WET PREP, GENITAL
Clue Cells Wet Prep HPF POC: NONE SEEN
Sperm: NONE SEEN
Trich, Wet Prep: NONE SEEN
WBC, Wet Prep HPF POC: NONE SEEN
Yeast Wet Prep HPF POC: NONE SEEN

## 2019-11-05 LAB — HIV ANTIBODY (ROUTINE TESTING W REFLEX): HIV Screen 4th Generation wRfx: NONREACTIVE

## 2019-11-05 LAB — PREGNANCY, URINE: Preg Test, Ur: NEGATIVE

## 2019-11-05 MED ORDER — HYDROCODONE-ACETAMINOPHEN 5-325 MG PO TABS: 1.0000 | ORAL_TABLET | Freq: Four times a day (QID) | ORAL | 0 refills | Status: DC | PRN

## 2019-11-05 MED ORDER — ONDANSETRON HCL 4 MG/2ML IJ SOLN
4.0000 mg | Freq: Once | INTRAMUSCULAR | Status: AC
  Administered 2019-11-05: 4 mg via INTRAVENOUS
  Filled 2019-11-05: qty 2

## 2019-11-05 MED ORDER — ONDANSETRON 8 MG PO TBDP: 8.0000 mg | ORAL_TABLET | Freq: Three times a day (TID) | ORAL | 0 refills | Status: DC | PRN

## 2019-11-05 MED ORDER — MORPHINE SULFATE (PF) 4 MG/ML IV SOLN
4.0000 mg | Freq: Once | INTRAVENOUS | Status: AC
  Administered 2019-11-05: 4 mg via INTRAVENOUS
  Filled 2019-11-05: qty 1

## 2019-11-05 MED ORDER — KETOROLAC TROMETHAMINE 30 MG/ML IJ SOLN
30.0000 mg | Freq: Once | INTRAMUSCULAR | Status: AC
  Administered 2019-11-05: 30 mg via INTRAVENOUS
  Filled 2019-11-05: qty 1

## 2019-11-05 MED ORDER — NAPROXEN 375 MG PO TABS: 375.0000 mg | ORAL_TABLET | Freq: Two times a day (BID) | ORAL | 0 refills | Status: DC

## 2019-11-05 MED ORDER — CEFTRIAXONE SODIUM 250 MG IJ SOLR: 250.0000 mg | Freq: Once | INTRAMUSCULAR | Status: DC

## 2019-11-05 MED ORDER — SODIUM CHLORIDE 0.9 % IV BOLUS
1000.0000 mL | Freq: Once | INTRAVENOUS | Status: AC
  Administered 2019-11-05: 1000 mL via INTRAVENOUS

## 2019-11-05 MED ORDER — DOXYCYCLINE HYCLATE 100 MG PO CAPS: 100.0000 mg | ORAL_CAPSULE | Freq: Two times a day (BID) | ORAL | 0 refills | Status: DC

## 2019-11-05 MED ORDER — SODIUM CHLORIDE 0.9 % IV SOLN
1.0000 g | Freq: Once | INTRAVENOUS | Status: AC
  Administered 2019-11-05: 1 g via INTRAVENOUS
  Filled 2019-11-05: qty 10

## 2019-11-05 MED FILL — NAPROXEN 375 MG TABLET: 375 | 10 days supply | Qty: 20 | Fill #0

## 2019-11-05 MED FILL — DOXYCYCLINE HYCLATE 100 MG: 100 | 10 days supply | Qty: 20 | Fill #0

## 2019-11-05 MED FILL — HYDROCODON-APAP 5-325: 5-325 | 3 days supply | Qty: 12 | Fill #0

## 2019-11-05 NOTE — ED Provider Notes (Signed)
Patient was seen by Dr. Charm Barges.  Please see his note.  His findings were concerning for the possibility of PID.  Ultrasound was performed to evaluate for TOA. Findings indicated below.  No signs of abscess or torsion.  Some evidence of fluid-filled bowel loops in the pelvis.  Plan on discharge home with prescriptions for doxycycline, Zofran, hydrocodone and Naprosyn.  Patient was given dose of Rocephin in the ED. US Transvaginal Non-ob  Result Date: 11/05/2019 CLINICAL DATA:  Severe, acute pelvic pain beginning 2 days ago. EXAM: TRANSABDOMINAL AND TRANSVAGINAL ULTRASOUND OF PELVIS DOPPLER ULTRASOUND OF OVARIES TECHNIQUE: Both transabdominal and transvaginal ultrasound examinations of the pelvis were performed. Transabdominal technique was performed for global imaging of the pelvis including uterus, ovaries, adnexal regions, and pelvic cul-de-sac. It was necessary to proceed with endovaginal exam following the transabdominal exam to visualize the ovaries and endometrial stripe and better visualize the uterus. Color and duplex Doppler ultrasound was utilized to evaluate blood flow to the ovaries. COMPARISON:  None. FINDINGS: Uterus Measurements: 7.0 x 4.3 x 3.7 cm = volume: 58 mL. Mildly heterogeneous. No discrete mass seen. Endometrium Thickness: 7.6 mm.  No focal abnormality visualized. Right ovary Measurements: 2.3 x 2.1 x 1.4 cm = volume: 4 mL. Normal appearance/no adnexal mass. Left ovary Measurements: 3.2 x 2.4 x 1.2 cm = volume: 5 mL. Normal appearance/no adnexal mass. Pulsed Doppler evaluation of both ovaries demonstrates normal low-resistance arterial and venous waveforms. Other findings No abnormal free fluid. Normal caliber fluid-filled bowel loops in the pelvis with mild diffuse wall thickening. IMPRESSION: 1. Mildly heterogeneous uterus.  This can be seen with adenomyosis. 2. Normal appearing ovaries without torsion. 3. Multiple normal caliber fluid-filled bowel loops in the pelvis with mild diffuse  wall thickening. This can be seen with gastroenteritis and enteritis. Electronically Signed   By: Beckie Salts M.D.   On: 11/05/2019 15:55   US Pelvis Complete  Result Date: 11/05/2019 CLINICAL DATA:  Severe, acute pelvic pain beginning 2 days ago. EXAM: TRANSABDOMINAL AND TRANSVAGINAL ULTRASOUND OF PELVIS DOPPLER ULTRASOUND OF OVARIES TECHNIQUE: Both transabdominal and transvaginal ultrasound examinations of the pelvis were performed. Transabdominal technique was performed for global imaging of the pelvis including uterus, ovaries, adnexal regions, and pelvic cul-de-sac. It was necessary to proceed with endovaginal exam following the transabdominal exam to visualize the ovaries and endometrial stripe and better visualize the uterus. Color and duplex Doppler ultrasound was utilized to evaluate blood flow to the ovaries. COMPARISON:  None. FINDINGS: Uterus Measurements: 7.0 x 4.3 x 3.7 cm = volume: 58 mL. Mildly heterogeneous. No discrete mass seen. Endometrium Thickness: 7.6 mm.  No focal abnormality visualized. Right ovary Measurements: 2.3 x 2.1 x 1.4 cm = volume: 4 mL. Normal appearance/no adnexal mass. Left ovary Measurements: 3.2 x 2.4 x 1.2 cm = volume: 5 mL. Normal appearance/no adnexal mass. Pulsed Doppler evaluation of both ovaries demonstrates normal low-resistance arterial and venous waveforms. Other findings No abnormal free fluid. Normal caliber fluid-filled bowel loops in the pelvis with mild diffuse wall thickening. IMPRESSION: 1. Mildly heterogeneous uterus.  This can be seen with adenomyosis. 2. Normal appearing ovaries without torsion. 3. Multiple normal caliber fluid-filled bowel loops in the pelvis with mild diffuse wall thickening. This can be seen with gastroenteritis and enteritis. Electronically Signed   By: Beckie Salts M.D.   On: 11/05/2019 15:55   [place] Art/ven Flow Abd Pelv Doppler  Result Date: 11/05/2019 CLINICAL DATA:  Severe, acute pelvic pain beginning 2 days ago. EXAM:  TRANSABDOMINAL AND  TRANSVAGINAL ULTRASOUND OF PELVIS DOPPLER ULTRASOUND OF OVARIES TECHNIQUE: Both transabdominal and transvaginal ultrasound examinations of the pelvis were performed. Transabdominal technique was performed for global imaging of the pelvis including uterus, ovaries, adnexal regions, and pelvic cul-de-sac. It was necessary to proceed with endovaginal exam following the transabdominal exam to visualize the ovaries and endometrial stripe and better visualize the uterus. Color and duplex Doppler ultrasound was utilized to evaluate blood flow to the ovaries. COMPARISON:  None. FINDINGS: Uterus Measurements: 7.0 x 4.3 x 3.7 cm = volume: 58 mL. Mildly heterogeneous. No discrete mass seen. Endometrium Thickness: 7.6 mm.  No focal abnormality visualized. Right ovary Measurements: 2.3 x 2.1 x 1.4 cm = volume: 4 mL. Normal appearance/no adnexal mass. Left ovary Measurements: 3.2 x 2.4 x 1.2 cm = volume: 5 mL. Normal appearance/no adnexal mass. Pulsed Doppler evaluation of both ovaries demonstrates normal low-resistance arterial and venous waveforms. Other findings No abnormal free fluid. Normal caliber fluid-filled bowel loops in the pelvis with mild diffuse wall thickening. IMPRESSION: 1. Mildly heterogeneous uterus.  This can be seen with adenomyosis. 2. Normal appearing ovaries without torsion. 3. Multiple normal caliber fluid-filled bowel loops in the pelvis with mild diffuse wall thickening. This can be seen with gastroenteritis and enteritis. Electronically Signed   By: Claudie Revering M.D.   On: 11/05/2019 15:55      Dorie Rank, MD 11/05/19 [phone]

## 2019-11-05 NOTE — ED Triage Notes (Signed)
Pt c/o lower pelvic pain that started 11/7-LMP 11/5-NAD-steady gait

## 2019-11-05 NOTE — ED Notes (Signed)
Patient transported to Ultrasound 

## 2019-11-05 NOTE — ED Provider Notes (Signed)
[facility] EMERGENCY DEPARTMENT Provider Note   CSN: [id number] Arrival date & time: 11/05/19  1242     History   Chief Complaint Chief Complaint  Patient presents with  . Pelvic Pain    HPI Morgan Hammond is a 38 y.o. female.  She is complaining of 2 days of low pelvic pain that is now radiates into her abdomen.  It is associated with dysuria.  She had her period on the fifth and is just finishing now.  She has had some vaginal discharge.  She was treated for an STD before and she says this feels similar.  Denies any fevers or chills chest pain shortness of breath.  She is sexually active and inconsistently uses protection.     The history is provided by the patient.  Pelvic Pain This is a new problem. The current episode started 2 days ago. The problem occurs constantly. The problem has been gradually worsening. Associated symptoms include abdominal pain. Pertinent negatives include no chest pain, no headaches and no shortness of breath. The symptoms are aggravated by bending and twisting. Nothing relieves the symptoms. She has tried nothing for the symptoms. The treatment provided no relief.    Past Medical History:  Diagnosis Date  . Hypertension    occassional, not on meds  . MRSA (methicillin resistant Staphylococcus aureus)     There are no active problems to display for this patient.   Past Surgical History:  Procedure Laterality Date  . INCISION AND DRAINAGE       OB History   No obstetric history on file.      Home Medications    Prior to Admission medications   Medication Sig Start Date End Date Taking? Authorizing Provider  acetaminophen (TYLENOL) 500 MG tablet Take 1,000 mg by mouth every 6 (six) hours as needed for mild pain.    [provider]  albuterol (PROVENTIL HFA;VENTOLIN HFA) 108 (90 Base) MCG/ACT inhaler Inhale 1-2 puffs into the lungs every 6 (six) hours as needed for wheezing or shortness of breath. 01/07/17   Long,  Arlyss Repress, MD  benzonatate (TESSALON) 100 MG capsule Take 1 capsule (100 mg total) by mouth every 8 (eight) hours. 01/07/17   Long, Arlyss Repress, MD  cyclobenzaprine (FLEXERIL) 5 MG tablet Take 1 tablet (5 mg total) by mouth 3 (three) times daily as needed for muscle spasms. 10/17/15   Cheri Fowler, PA-C  doxycycline (VIBRAMYCIN) 100 MG capsule Take 1 capsule (100 mg total) by mouth 2 (two) times daily. 11/23/16   Ward, Layla Maw, DO  fluticasone (FLONASE) 50 MCG/ACT nasal spray Place 2 sprays into both nostrils daily. 03/30/19   Couture, Cortni S, PA-C  ibuprofen (ADVIL,MOTRIN) 800 MG tablet Take 1 tablet (800 mg total) by mouth 3 (three) times daily. 10/17/15   Cheri Fowler, PA-C  metroNIDAZOLE (FLAGYL) 500 MG tablet Take 1 tablet (500 mg total) by mouth 2 (two) times daily. 11/23/16   Ward, Layla Maw, DO  metroNIDAZOLE (METROGEL VAGINAL) 0.75 % vaginal gel Place 1 Applicatorful vaginally 2 (two) times daily. For 5 days 11/23/16   Ward, Layla Maw, DO  trimethoprim-polymyxin b (POLYTRIM) ophthalmic solution Place 1 drop into the right eye every 4 (four) hours. 01/05/19   Henderly, Britni A, PA-C    Family History Family History  Problem Relation Age of Onset  . Hypertension Other   . Diabetes Other   . Coronary artery disease Other     Social History Social History   Tobacco Use  .  Smoking status: Current Every Day Smoker    Types: Cigarettes  . Smokeless tobacco: Never Used  Substance Use Topics  . Alcohol use: Yes    Comment: weekly  . Drug use: No    Comment: former     Allergies   Patient has no known allergies.   Review of Systems Review of Systems  Constitutional: Positive for appetite change. Negative for fever.  HENT: Negative for sore throat.   Eyes: Negative for visual disturbance.  Respiratory: Negative for shortness of breath.   Cardiovascular: Negative for chest pain.  Gastrointestinal: Positive for abdominal pain. Negative for nausea and vomiting.  Genitourinary:  Positive for pelvic pain, vaginal bleeding and vaginal discharge. Negative for dysuria.  Musculoskeletal: Negative for myalgias.  Skin: Negative for rash.  Neurological: Negative for headaches.     Physical Exam Updated Vital Signs BP 121/90 (BP Location: Left Arm)   Pulse 93   Temp 98.6 F (37 C) (Oral)   Resp 16   Ht  (1.778 m)   Wt 60.3 kg   LMP 11/01/2019   SpO2 98%   BMI 19.08 kg/m   Physical Exam Vitals signs and nursing note reviewed. Exam conducted with a chaperone present.  Constitutional:      General: She is not in acute distress.    Appearance: She is well-developed.  HENT:     Head: Normocephalic and atraumatic.  Eyes:     Conjunctiva/sclera: Conjunctivae normal.  Neck:     Musculoskeletal: Neck supple.  Cardiovascular:     Rate and Rhythm: Normal rate and regular rhythm.     Heart sounds: No murmur.  Pulmonary:     Effort: Pulmonary effort is normal. No respiratory distress.     Breath sounds: Normal breath sounds.  Abdominal:     Palpations: Abdomen is soft. There is no mass.     Tenderness: There is abdominal tenderness (diffuse, more suprapubic). There is guarding.  Genitourinary:    General: Normal vulva.     Labia:        Right: No tenderness.        Left: No tenderness.      Vagina: Vaginal discharge (pink thin) present.     Cervix: Cervical motion tenderness and discharge present.     Uterus: Tender.      Adnexa: Right adnexa normal and left adnexa normal.       Right: No mass or tenderness.         Left: No mass or tenderness.    Musculoskeletal: Normal range of motion.     Right lower leg: No edema.     Left lower leg: No edema.  Skin:    General: Skin is warm and dry.     Capillary Refill: Capillary refill takes less than 2 seconds.  Neurological:     General: No focal deficit present.     Mental Status: She is alert.      ED Treatments / Results  Labs (all labs ordered are listed, but only abnormal results are  displayed) Labs Reviewed  URINALYSIS, ROUTINE W REFLEX MICROSCOPIC - Abnormal; Notable for the following components:      Result Value   APPearance CLOUDY (*)    Specific Gravity, Urine >1.030 (*)    Hgb urine dipstick LARGE (*)    Bilirubin Urine SMALL (*)    Protein, ur 30 (*)    Leukocytes,Ua TRACE (*)    All other components within normal limits  BASIC METABOLIC PANEL -  Abnormal; Notable for the following components:   Potassium 3.4 (*)    Creatinine, Ser 1.13 (*)    All other components within normal limits  CBC WITH DIFFERENTIAL/PLATELET - Abnormal; Notable for the following components:   WBC 14.6 (*)    Neutro Abs 12.7 (*)    All other components within normal limits  URINALYSIS, MICROSCOPIC (REFLEX) - Abnormal; Notable for the following components:   Bacteria, UA MANY (*)    All other components within normal limits  WET PREP, GENITAL  PREGNANCY, URINE  RPR  HIV ANTIBODY (ROUTINE TESTING W REFLEX)  GC/CHLAMYDIA PROBE AMP ([place]) NOT AT [facility]    EKG None  Radiology US Transvaginal Non-ob  Result Date: 11/05/2019 CLINICAL DATA:  Severe, acute pelvic pain beginning 2 days ago. EXAM: TRANSABDOMINAL AND TRANSVAGINAL ULTRASOUND OF PELVIS DOPPLER ULTRASOUND OF OVARIES TECHNIQUE: Both transabdominal and transvaginal ultrasound examinations of the pelvis were performed. Transabdominal technique was performed for global imaging of the pelvis including uterus, ovaries, adnexal regions, and pelvic cul-de-sac. It was necessary to proceed with endovaginal exam following the transabdominal exam to visualize the ovaries and endometrial stripe and better visualize the uterus. Color and duplex Doppler ultrasound was utilized to evaluate blood flow to the ovaries. COMPARISON:  None. FINDINGS: Uterus Measurements: 7.0 x 4.3 x 3.7 cm = volume: 58 mL. Mildly heterogeneous. No discrete mass seen. Endometrium Thickness: 7.6 mm.  No focal abnormality visualized. Right ovary Measurements: 2.3 x  2.1 x 1.4 cm = volume: 4 mL. Normal appearance/no adnexal mass. Left ovary Measurements: 3.2 x 2.4 x 1.2 cm = volume: 5 mL. Normal appearance/no adnexal mass. Pulsed Doppler evaluation of both ovaries demonstrates normal low-resistance arterial and venous waveforms. Other findings No abnormal free fluid. Normal caliber fluid-filled bowel loops in the pelvis with mild diffuse wall thickening. IMPRESSION: 1. Mildly heterogeneous uterus.  This can be seen with adenomyosis. 2. Normal appearing ovaries without torsion. 3. Multiple normal caliber fluid-filled bowel loops in the pelvis with mild diffuse wall thickening. This can be seen with gastroenteritis and enteritis. Electronically Signed   By: Beckie Salts M.D.   On: 11/05/2019 15:55   US Pelvis Complete  Result Date: 11/05/2019 CLINICAL DATA:  Severe, acute pelvic pain beginning 2 days ago. EXAM: TRANSABDOMINAL AND TRANSVAGINAL ULTRASOUND OF PELVIS DOPPLER ULTRASOUND OF OVARIES TECHNIQUE: Both transabdominal and transvaginal ultrasound examinations of the pelvis were performed. Transabdominal technique was performed for global imaging of the pelvis including uterus, ovaries, adnexal regions, and pelvic cul-de-sac. It was necessary to proceed with endovaginal exam following the transabdominal exam to visualize the ovaries and endometrial stripe and better visualize the uterus. Color and duplex Doppler ultrasound was utilized to evaluate blood flow to the ovaries. COMPARISON:  None. FINDINGS: Uterus Measurements: 7.0 x 4.3 x 3.7 cm = volume: 58 mL. Mildly heterogeneous. No discrete mass seen. Endometrium Thickness: 7.6 mm.  No focal abnormality visualized. Right ovary Measurements: 2.3 x 2.1 x 1.4 cm = volume: 4 mL. Normal appearance/no adnexal mass. Left ovary Measurements: 3.2 x 2.4 x 1.2 cm = volume: 5 mL. Normal appearance/no adnexal mass. Pulsed Doppler evaluation of both ovaries demonstrates normal low-resistance arterial and venous waveforms. Other findings  No abnormal free fluid. Normal caliber fluid-filled bowel loops in the pelvis with mild diffuse wall thickening. IMPRESSION: 1. Mildly heterogeneous uterus.  This can be seen with adenomyosis. 2. Normal appearing ovaries without torsion. 3. Multiple normal caliber fluid-filled bowel loops in the pelvis with mild diffuse wall thickening. This can be  seen with gastroenteritis and enteritis. Electronically Signed   By: Claudie Revering M.D.   On: 11/05/2019 15:55   [place] Art/ven Flow Abd Pelv Doppler  Result Date: 11/05/2019 CLINICAL DATA:  Severe, acute pelvic pain beginning 2 days ago. EXAM: TRANSABDOMINAL AND TRANSVAGINAL ULTRASOUND OF PELVIS DOPPLER ULTRASOUND OF OVARIES TECHNIQUE: Both transabdominal and transvaginal ultrasound examinations of the pelvis were performed. Transabdominal technique was performed for global imaging of the pelvis including uterus, ovaries, adnexal regions, and pelvic cul-de-sac. It was necessary to proceed with endovaginal exam following the transabdominal exam to visualize the ovaries and endometrial stripe and better visualize the uterus. Color and duplex Doppler ultrasound was utilized to evaluate blood flow to the ovaries. COMPARISON:  None. FINDINGS: Uterus Measurements: 7.0 x 4.3 x 3.7 cm = volume: 58 mL. Mildly heterogeneous. No discrete mass seen. Endometrium Thickness: 7.6 mm.  No focal abnormality visualized. Right ovary Measurements: 2.3 x 2.1 x 1.4 cm = volume: 4 mL. Normal appearance/no adnexal mass. Left ovary Measurements: 3.2 x 2.4 x 1.2 cm = volume: 5 mL. Normal appearance/no adnexal mass. Pulsed Doppler evaluation of both ovaries demonstrates normal low-resistance arterial and venous waveforms. Other findings No abnormal free fluid. Normal caliber fluid-filled bowel loops in the pelvis with mild diffuse wall thickening. IMPRESSION: 1. Mildly heterogeneous uterus.  This can be seen with adenomyosis. 2. Normal appearing ovaries without torsion. 3. Multiple normal caliber  fluid-filled bowel loops in the pelvis with mild diffuse wall thickening. This can be seen with gastroenteritis and enteritis. Electronically Signed   By: Claudie Revering M.D.   On: 11/05/2019 15:55    Procedures Procedures (including critical care time)  Medications Ordered in ED Medications  sodium chloride 0.9 % bolus 1,000 mL (has no administration in time range)  morphine 4 MG/ML injection 4 mg (has no administration in time range)  ondansetron (ZOFRAN) injection 4 mg (has no administration in time range)     Initial Impression / Assessment and Plan / ED Course  I have reviewed the triage vital signs and the nursing notes.  Pertinent labs & imaging results that were available during my care of the patient were reviewed by me and considered in my medical decision making (see chart for details).  Clinical Course as of Nov 04 1718  Mon Nov 04, 7642  4517 38 year old female with prior history of an STD complaining of 2 days of pelvic pain dysuria.  LMP started 4 days ago and is finishing up.  Differential includes PID, torsion, TOA, UTI, intra-abdominal process.   [MB]  0962 Urinalysis shows 6-10 whites many bacteria but has a lot of squames.  Likely contaminated sample.  White blood cell count elevated at 14.6.  She is received some nausea medicine and pain medicine along with IV fluids.  I put her in for pelvic ultrasound to evaluate for torsion.   [MB]  1503 Patient was signed out to Dr. Tomi Bamberger with pelvic ultrasound pending.  Disposition per results of ultrasound.   [MB]    Clinical Course User Index [MB] Hayden Rasmussen, MD        Final Clinical Impressions(s) / ED Diagnoses   Final diagnoses:  PID (acute pelvic inflammatory disease)    ED Discharge Orders         Ordered    naproxen (NAPROSYN) 375 MG tablet  2 times daily     11/05/19 1630    doxycycline (VIBRAMYCIN) 100 MG capsule  2 times daily     11/05/19 1630  HYDROcodone-acetaminophen (NORCO/VICODIN) 5-325  MG tablet  Every 6 hours PRN     11/05/19 1630    ondansetron (ZOFRAN ODT) 8 MG disintegrating tablet  Every 8 hours PRN     11/05/19 1630           Terrilee FilesButler, Din Bookwalter C, MD 11/05/19 1719

## 2019-11-06 LAB — RPR: RPR Ser Ql: NONREACTIVE

## 2019-11-07 LAB — GC/CHLAMYDIA PROBE AMP (~~LOC~~) NOT AT ARMC
Chlamydia: POSITIVE — AB
Neisseria Gonorrhea: POSITIVE — AB

## 2020-02-05 ENCOUNTER — Encounter (HOSPITAL_BASED_OUTPATIENT_CLINIC_OR_DEPARTMENT_OTHER): Payer: Self-pay | Admitting: Emergency Medicine

## 2020-02-05 ENCOUNTER — Other Ambulatory Visit: Payer: Self-pay

## 2020-02-05 ENCOUNTER — Emergency Department (HOSPITAL_BASED_OUTPATIENT_CLINIC_OR_DEPARTMENT_OTHER)
Admission: EM | Admit: 2020-02-05 | Discharge: 2020-02-05 | Disposition: A | Discharge status: Home / Self Care | Payer: Self-pay | Attending: Emergency Medicine | Admitting: Emergency Medicine

## 2020-02-05 DIAGNOSIS — B9689 Other specified bacterial agents as the cause of diseases classified elsewhere: Secondary | ICD-10-CM

## 2020-02-05 DIAGNOSIS — R110 Nausea: Secondary | ICD-10-CM | POA: Insufficient documentation

## 2020-02-05 DIAGNOSIS — Z79899 Other long term (current) drug therapy: Secondary | ICD-10-CM | POA: Insufficient documentation

## 2020-02-05 DIAGNOSIS — N3000 Acute cystitis without hematuria: Secondary | ICD-10-CM | POA: Insufficient documentation

## 2020-02-05 DIAGNOSIS — N390 Urinary tract infection, site not specified: Secondary | ICD-10-CM

## 2020-02-05 DIAGNOSIS — N760 Acute vaginitis: Secondary | ICD-10-CM | POA: Insufficient documentation

## 2020-02-05 DIAGNOSIS — I10 Essential (primary) hypertension: Secondary | ICD-10-CM | POA: Insufficient documentation

## 2020-02-05 LAB — URINALYSIS, MICROSCOPIC (REFLEX)

## 2020-02-05 LAB — CBC WITH DIFFERENTIAL/PLATELET
Abs Immature Granulocytes: 0.01 10*3/uL (ref 0.00–0.07)
Basophils Absolute: 0 10*3/uL (ref 0.0–0.1)
Basophils Relative: 1 %
Eosinophils Absolute: 0.1 10*3/uL (ref 0.0–0.5)
Eosinophils Relative: 2 %
HCT: 40.7 % (ref 36.0–46.0)
Hemoglobin: 13 g/dL (ref 12.0–15.0)
Immature Granulocytes: 0 %
Lymphocytes Relative: 32 %
Lymphs Abs: 1.3 10*3/uL (ref 0.7–4.0)
MCH: 31.9 pg (ref 26.0–34.0)
MCHC: 31.9 g/dL (ref 30.0–36.0)
MCV: 100 fL (ref 80.0–100.0)
Monocytes Absolute: 0.4 10*3/uL (ref 0.1–1.0)
Monocytes Relative: 10 %
Neutro Abs: 2.3 10*3/uL (ref 1.7–7.7)
Neutrophils Relative %: 55 %
Platelets: 195 10*3/uL (ref 150–400)
RBC: 4.07 MIL/uL (ref 3.87–5.11)
RDW: 14.7 % (ref 11.5–15.5)
WBC: 4.1 10*3/uL (ref 4.0–10.5)
nRBC: 0 % (ref 0.0–0.2)

## 2020-02-05 LAB — COMPREHENSIVE METABOLIC PANEL
ALT: 17 U/L (ref 0–44)
AST: 19 U/L (ref 15–41)
Albumin: 3.9 g/dL (ref 3.5–5.0)
Alkaline Phosphatase: 37 U/L — ABNORMAL LOW (ref 38–126)
Anion gap: 7 (ref 5–15)
BUN: 9 mg/dL (ref 6–20)
CO2: 24 mmol/L (ref 22–32)
Calcium: 9.2 mg/dL (ref 8.9–10.3)
Chloride: 106 mmol/L (ref 98–111)
Creatinine, Ser: 0.93 mg/dL (ref 0.44–1.00)
GFR calc Af Amer: >60 mL/min (ref >60)
GFR calc non Af Amer: >60 mL/min (ref >60)
Glucose, Bld: 122 mg/dL — ABNORMAL HIGH (ref 70–99)
Potassium: 3.7 mmol/L (ref 3.5–5.1)
Sodium: 137 mmol/L (ref 135–145)
Total Bilirubin: 0.3 mg/dL (ref 0.3–1.2)
Total Protein: 6.9 g/dL (ref 6.5–8.1)

## 2020-02-05 LAB — LIPASE, BLOOD: Lipase: 84 U/L — ABNORMAL HIGH (ref 11–51)

## 2020-02-05 LAB — URINALYSIS, ROUTINE W REFLEX MICROSCOPIC
Bilirubin Urine: NEGATIVE
Glucose, UA: NEGATIVE mg/dL
Hgb urine dipstick: NEGATIVE
Ketones, ur: NEGATIVE mg/dL
Nitrite: NEGATIVE
Protein, ur: NEGATIVE mg/dL
Specific Gravity, Urine: 1.025 (ref 1.005–1.030)
pH: 5.5 (ref 5.0–8.0)

## 2020-02-05 LAB — WET PREP, GENITAL
Sperm: NONE SEEN
Trich, Wet Prep: NONE SEEN
Yeast Wet Prep HPF POC: NONE SEEN

## 2020-02-05 LAB — PREGNANCY, URINE: Preg Test, Ur: NEGATIVE

## 2020-02-05 MED ORDER — DOXYCYCLINE HYCLATE 100 MG PO TABS
100.0000 mg | ORAL_TABLET | Freq: Once | ORAL | Status: AC
  Administered 2020-02-05: 100 mg via ORAL
  Filled 2020-02-05: qty 1

## 2020-02-05 MED ORDER — CEFTRIAXONE SODIUM 500 MG IJ SOLR
500.0000 mg | Freq: Once | INTRAMUSCULAR | Status: AC
  Administered 2020-02-05: 09:00:00 500 mg via INTRAMUSCULAR
  Filled 2020-02-05: qty 500

## 2020-02-05 MED ORDER — ONDANSETRON 8 MG PO TBDP: 8.0000 mg | ORAL_TABLET | Freq: Three times a day (TID) | ORAL | 0 refills | Status: AC | PRN

## 2020-02-05 MED ORDER — LIDOCAINE HCL (PF) 1 % IJ SOLN
INTRAMUSCULAR | Status: AC
  Administered 2020-02-05: 09:00:00 1 mL
  Filled 2020-02-05: qty 5

## 2020-02-05 MED ORDER — KETOROLAC TROMETHAMINE 15 MG/ML IJ SOLN
15.0000 mg | Freq: Once | INTRAMUSCULAR | Status: AC
  Administered 2020-02-05: 15 mg via INTRAVENOUS
  Filled 2020-02-05: qty 1

## 2020-02-05 MED ORDER — ONDANSETRON HCL 4 MG/2ML IJ SOLN
4.0000 mg | Freq: Once | INTRAMUSCULAR | Status: AC
  Administered 2020-02-05: 09:00:00 4 mg via INTRAVENOUS
  Filled 2020-02-05: qty 2

## 2020-02-05 MED ORDER — DOXYCYCLINE HYCLATE 100 MG PO CAPS: 100.0000 mg | ORAL_CAPSULE | Freq: Two times a day (BID) | ORAL | 0 refills | Status: AC

## 2020-02-05 MED ORDER — CEPHALEXIN 500 MG PO CAPS: 500.0000 mg | ORAL_CAPSULE | Freq: Four times a day (QID) | ORAL | 0 refills | Status: AC

## 2020-02-05 MED ORDER — METRONIDAZOLE 500 MG PO TABS: 500.0000 mg | ORAL_TABLET | Freq: Two times a day (BID) | ORAL | 0 refills | Status: AC

## 2020-02-05 MED FILL — ONDANSETRON ODT 8 MG TABLET: 8 | 20 days supply | Qty: 12 | Fill #0

## 2020-02-05 MED FILL — metroNIDAZOLE 500 MG TABS: 500 | 7 days supply | Qty: 14 | Fill #0

## 2020-02-05 MED FILL — CEPHALEXIN 500 MG CAPSULE: 500 | 5 days supply | Qty: 20 | Fill #0

## 2020-02-05 MED FILL — DOXYCYCLINE HYCLATE 100 MG: 100 | 7 days supply | Qty: 13 | Fill #0

## 2020-02-05 NOTE — ED Triage Notes (Signed)
Pt c/o RLQ and pelvic pain and LUQ pain with Nausea x 1 month with symptoms not improving. Pt reports prior treatment in this ED for same symptoms. Pt denies vomiting, endorses non compliance with prescribed nausea meds given on last visit. Pt reports taking abx

## 2020-02-05 NOTE — ED Notes (Signed)
Pt reports no daily or regular medications being taken at this time

## 2020-02-05 NOTE — Discharge Instructions (Addendum)
Please take the antibiotics as prescribed for your urinary tract infection, bacterial vaginosis and suspected sexually transmitted disease.  Return to ER if you develop worsening abdominal pain, vomiting, fever or other new concerning symptom.  Do not take the antibiotics with alcohol due to potential side effects. Recommend recheck with your primary doctor later this week

## 2020-02-05 NOTE — ED Provider Notes (Signed)
[facility] EMERGENCY DEPARTMENT Provider Note   CSN: [id number] Arrival date & time: 02/05/20  2376     History Chief Complaint  Patient presents with  . Abdominal Pain  . Nausea    Morgan Hammond is a 39 y.o. female.  Presents to ER with complaints of lower abdominal/pelvic pain, nausea.  Reports symptoms have been going on for approximately 1 month.  Has had mild mostly white discharge.  No recent changes.  Symptoms similar to presentation in November.  Pain dull, crampy, right, middle and left side.  Constant.  Not sudden onset.  No fevers.  No vomiting but has had relatively constant nausea.  Has not taken any medications for this recently.  Back in November seen in ER for similar symptoms, diagnosed with gonorrhea, chlamydia, PID.  Pelvic ultrasound negative for torsion, TOA.  Patient reports completing course of antibiotics.  She was sexually active with female partner only since being treated in November, states she reported her past infection to partner, but partner did not get tested or treated.  Has had mild burning with urination, no hematuria.  No prior surgical history.  PMH HTN not on meds.   HPI     Past Medical History:  Diagnosis Date  . Hypertension    occassional, not on meds  . MRSA (methicillin resistant Staphylococcus aureus)     There are no problems to display for this patient.   Past Surgical History:  Procedure Laterality Date  . INCISION AND DRAINAGE       OB History   No obstetric history on file.     Family History  Problem Relation Age of Onset  . Hypertension Other   . Diabetes Other   . Coronary artery disease Other     Social History   Tobacco Use  . Smoking status: Current Every Day Smoker    Types: Cigarettes  . Smokeless tobacco: Never Used  Substance Use Topics  . Alcohol use: Yes    Comment: weekly  . Drug use: No    Comment: former    Home Medications Prior to Admission medications   Medication Sig  Start Date End Date Taking? Authorizing Provider  acetaminophen (TYLENOL) 500 MG tablet Take 1,000 mg by mouth every 6 (six) hours as needed for mild pain.    [provider]  albuterol (PROVENTIL HFA;VENTOLIN HFA) 108 (90 Base) MCG/ACT inhaler Inhale 1-2 puffs into the lungs every 6 (six) hours as needed for wheezing or shortness of breath. 01/07/17   Long, Wonda Olds, MD  benzonatate (TESSALON) 100 MG capsule Take 1 capsule (100 mg total) by mouth every 8 (eight) hours. 01/07/17   Long, Wonda Olds, MD  cephALEXin (KEFLEX) 500 MG capsule Take 1 capsule (500 mg total) by mouth 4 (four) times daily for 5 days. 02/05/20 02/10/20  Lucrezia Starch, MD  cyclobenzaprine (FLEXERIL) 5 MG tablet Take 1 tablet (5 mg total) by mouth 3 (three) times daily as needed for muscle spasms. 10/17/15   Gloriann Loan, PA-C  doxycycline (VIBRAMYCIN) 100 MG capsule Take 1 capsule (100 mg total) by mouth 2 (two) times daily for 7 days. 02/05/20 02/12/20  Lucrezia Starch, MD  fluticasone (FLONASE) 50 MCG/ACT nasal spray Place 2 sprays into both nostrils daily. 03/30/19   Couture, Cortni S, PA-C  HYDROcodone-acetaminophen (NORCO/VICODIN) 5-325 MG tablet Take 1 tablet by mouth every 6 (six) hours as needed. 11/05/19   Dorie Rank, MD  ibuprofen (ADVIL,MOTRIN) 800 MG tablet Take 1 tablet (800  mg total) by mouth 3 (three) times daily. 10/17/15   Cheri Fowler, PA-C  metroNIDAZOLE (FLAGYL) 500 MG tablet Take 1 tablet (500 mg total) by mouth 2 (two) times daily for 7 days. 02/05/20 02/12/20  Milagros Loll, MD  metroNIDAZOLE (METROGEL VAGINAL) 0.75 % vaginal gel Place 1 Applicatorful vaginally 2 (two) times daily. For 5 days 11/23/16   Ward, Layla Maw, DO  naproxen (NAPROSYN) 375 MG tablet Take 1 tablet (375 mg total) by mouth 2 (two) times daily. 11/05/19   Linwood Dibbles, MD  ondansetron (ZOFRAN ODT) 8 MG disintegrating tablet Take 1 tablet (8 mg total) by mouth every 8 (eight) hours as needed for nausea or vomiting. 02/05/20   Milagros Loll, MD  trimethoprim-polymyxin b (POLYTRIM) ophthalmic solution Place 1 drop into the right eye every 4 (four) hours. 01/05/19   Henderly, Britni A, PA-C    Allergies    Patient has no known allergies.  Review of Systems   Review of Systems  Constitutional: Negative for chills and fever.  HENT: Negative for ear pain and sore throat.   Eyes: Negative for pain and visual disturbance.  Respiratory: Negative for cough and shortness of breath.   Cardiovascular: Negative for chest pain and palpitations.  Gastrointestinal: Positive for nausea. Negative for abdominal pain and vomiting.  Genitourinary: Positive for dysuria, pelvic pain and vaginal discharge. Negative for hematuria.  Musculoskeletal: Negative for arthralgias and back pain.  Skin: Negative for color change and rash.  Neurological: Negative for seizures and syncope.  All other systems reviewed and are negative.   Physical Exam Updated Vital Signs BP 116/87 (BP Location: Right Arm)   Pulse 65   Temp 97.8 F (36.6 C) (Oral)   Resp 16   Ht 5\' 9"  (1.753 m)   Wt 65.8 kg   LMP 01/11/2020 (Approximate)   SpO2 100%   BMI 21.41 kg/m   Physical Exam Vitals and nursing note reviewed.  Constitutional:      General: She is not in acute distress.    Appearance: She is well-developed.  HENT:     Head: Normocephalic and atraumatic.  Eyes:     Conjunctiva/sclera: Conjunctivae normal.  Cardiovascular:     Rate and Rhythm: Normal rate and regular rhythm.     Heart sounds: No murmur.  Pulmonary:     Effort: Pulmonary effort is normal. No respiratory distress.     Breath sounds: Normal breath sounds.  Abdominal:     Palpations: Abdomen is soft.     Tenderness: There is no abdominal tenderness.     Comments: Mild TTP across lower abd, no focal TTP at [place]Burney's  Genitourinary:    Comments: Moderate thick white discharge present, cervix appears normal, no adnexal mass or TTP b/l, no CMT Musculoskeletal:     Cervical  back: Neck supple.  Skin:    General: Skin is warm and dry.  Neurological:     Mental Status: She is alert.     ED Results / Procedures / Treatments   Labs (all labs ordered are listed, but only abnormal results are displayed) Labs Reviewed  WET PREP, GENITAL - Abnormal; Notable for the following components:      Result Value   Clue Cells Wet Prep HPF POC PRESENT (*)    WBC, Wet Prep HPF POC MANY (*)    All other components within normal limits  COMPREHENSIVE METABOLIC PANEL - Abnormal; Notable for the following components:   Glucose, Bld 122 (*)  Alkaline Phosphatase 37 (*)    All other components within normal limits  LIPASE, BLOOD - Abnormal; Notable for the following components:   Lipase 84 (*)    All other components within normal limits  URINALYSIS, ROUTINE W REFLEX MICROSCOPIC - Abnormal; Notable for the following components:   APPearance CLOUDY (*)    Leukocytes,Ua MODERATE (*)    All other components within normal limits  URINALYSIS, MICROSCOPIC (REFLEX) - Abnormal; Notable for the following components:   Bacteria, UA MANY (*)    All other components within normal limits  WET PREP, GENITAL  CBC WITH DIFFERENTIAL/PLATELET  PREGNANCY, URINE  GC/CHLAMYDIA PROBE AMP ([place]) NOT AT [facility]    EKG None  Radiology No results found.  Procedures Procedures (including critical care time)  Medications Ordered in ED Medications  ketorolac (TORADOL) 15 MG/ML injection 15 mg (15 mg Intravenous Given 02/05/20 0839)  ondansetron (ZOFRAN) injection 4 mg (4 mg Intravenous Given 02/05/20 0838)  cefTRIAXone (ROCEPHIN) injection 500 mg (500 mg Intramuscular Given 02/05/20 0907)  doxycycline (VIBRA-TABS) tablet 100 mg (100 mg Oral Given 02/05/20 0908)  lidocaine (PF) (XYLOCAINE) 1 % injection (1 mL  Given 02/05/20 0908)    ED Course  I have reviewed the triage vital signs and the nursing notes.  Pertinent labs & imaging results that were available during my care of the patient  were reviewed by me and considered in my medical decision making (see chart for details).  Clinical Course as of Feb 04 1002  Tue Feb 05, 2020  0855 Completed pelvic - Augusto Gamble chaperone   [RD]  6789 Rechecked, updated on results   [RD]    Clinical Course User Index [RD] Milagros Loll, MD   MDM Rules/Calculators/A&P                       39 year old with 1 month lower abdominal/pelvic pain, nausea and vaginal discharge.  No CMT, no adnexal fullness or tenderness.  Work-up concerning for urinary tract infection, BV.  Given recent GC, chlamydial infections, concern may have recurrent infections.  Will treat empirically with Rocephin, doxycycline.  Will treat with cephalexin for UTI.  Will treat Flagyl for BV.  Low clinical suspicion for appendicitis given duration of symptoms, no leukocytosis, no focal tenderness on exam.  Very low clinical suspicion for ovarian torsion.  Again long duration of symptoms, dull aching constant, no focal tenderness on bimanual exam.  Recent pelvic ultrasound was grossly normal.  Reviewed strict return precautions with patient should she develop more abdominal pain or more pelvic pain.  Recommended notifying sexual partners and refrain from further sexual contact until they have been tested and treated.    After the discussed management above, the patient was determined to be safe for discharge.  The patient was in agreement with this plan and all questions regarding their care were answered.  ED return precautions were discussed and the patient will return to the ED with any significant worsening of condition.    Final Clinical Impression(s) / ED Diagnoses Final diagnoses:  Bacterial vaginosis  Urinary tract infection without hematuria, site unspecified    Rx / DC Orders ED Discharge Orders         Ordered    cephALEXin (KEFLEX) 500 MG capsule  4 times daily     02/05/20 0938    doxycycline (VIBRAMYCIN) 100 MG capsule  2 times daily     02/05/20 0938     metroNIDAZOLE (FLAGYL) 500 MG tablet  2 times daily     02/05/20 0938    ondansetron (ZOFRAN ODT) 8 MG disintegrating tablet  Every 8 hours PRN     02/05/20 2633           Milagros Loll, MD 02/05/20 1003

## 2020-02-05 NOTE — ED Notes (Signed)
ED Provider at bedside. 

## 2020-02-06 LAB — GC/CHLAMYDIA PROBE AMP (~~LOC~~) NOT AT ARMC
Chlamydia: NEGATIVE
Neisseria Gonorrhea: NEGATIVE

## 2020-03-17 ENCOUNTER — Ambulatory Visit: Payer: Self-pay | Admitting: Internal Medicine

## 2020-06-27 ENCOUNTER — Emergency Department (HOSPITAL_BASED_OUTPATIENT_CLINIC_OR_DEPARTMENT_OTHER)
Admission: EM | Admit: 2020-06-27 | Discharge: 2020-06-27 | Disposition: A | Discharge status: Home / Self Care | Payer: Managed Care, Other (non HMO) | Attending: Emergency Medicine | Admitting: Emergency Medicine

## 2020-06-27 ENCOUNTER — Encounter (HOSPITAL_BASED_OUTPATIENT_CLINIC_OR_DEPARTMENT_OTHER): Payer: Self-pay | Admitting: *Deleted

## 2020-06-27 ENCOUNTER — Other Ambulatory Visit: Payer: Self-pay

## 2020-06-27 DIAGNOSIS — F17210 Nicotine dependence, cigarettes, uncomplicated: Secondary | ICD-10-CM | POA: Insufficient documentation

## 2020-06-27 DIAGNOSIS — Z79899 Other long term (current) drug therapy: Secondary | ICD-10-CM | POA: Insufficient documentation

## 2020-06-27 DIAGNOSIS — R531 Weakness: Secondary | ICD-10-CM | POA: Diagnosis not present

## 2020-06-27 DIAGNOSIS — R200 Anesthesia of skin: Secondary | ICD-10-CM | POA: Diagnosis present

## 2020-06-27 DIAGNOSIS — I10 Essential (primary) hypertension: Secondary | ICD-10-CM | POA: Diagnosis not present

## 2020-06-27 DIAGNOSIS — R202 Paresthesia of skin: Secondary | ICD-10-CM

## 2020-06-27 NOTE — ED Triage Notes (Addendum)
C/o right hand swelling x 1 day  , denies injury, states had  Hair rubber band and wrist all night

## 2020-06-27 NOTE — Discharge Instructions (Signed)
Please rest your hand and wrist over the weekend.  Follow-up with Dr. Jordan Likes next week if you are not feeling better.  Return with worsening symptoms.

## 2020-06-27 NOTE — ED Provider Notes (Signed)
[facility] EMERGENCY DEPARTMENT Provider Note   CSN: [id number] Arrival date & time: 06/27/20  1756     History Chief Complaint  Patient presents with  . Hand Pain    Morgan Hammond is a 39 y.o. female.  Patient presents the emergency department with complains of numbness and tingling on the dorsal aspect of her right hand ongoing over the past 1 day.  Patient states that she had a hair tie on her wrist for least several hours, she might of slept in it.  Also while at work yesterday, she took a brief nap and rested her hand on her wrist while on break.  It was after this time that she noticed the tingling.  She did not have any overt weakness, but states that she may have felt weak while she was lifting boxes.  She presents today for more of a sensation deficit.  No color change.  No elbow or shoulder injury.  Patient denies signs of stroke including: facial droop, slurred speech, aphasia, weakness/numbness in extremities, imbalance/trouble walking.        Past Medical History:  Diagnosis Date  . Hypertension    occassional, not on meds  . MRSA (methicillin resistant Staphylococcus aureus)     There are no problems to display for this patient.   Past Surgical History:  Procedure Laterality Date  . INCISION AND DRAINAGE       OB History   No obstetric history on file.     Family History  Problem Relation Age of Onset  . Hypertension Other   . Diabetes Other   . Coronary artery disease Other     Social History   Tobacco Use  . Smoking status: Current Every Day Smoker    Types: Cigarettes  . Smokeless tobacco: Never Used  Vaping Use  . Vaping Use: Never used  Substance Use Topics  . Alcohol use: Yes    Comment: weekly  . Drug use: No    Comment: former    Home Medications Prior to Admission medications   Medication Sig Start Date End Date Taking? Authorizing Provider  ondansetron (ZOFRAN ODT) 8 MG disintegrating tablet Take 1 tablet (8 mg  total) by mouth every 8 (eight) hours as needed for nausea or vomiting. 02/05/20   Milagros Loll, MD    Allergies    Patient has no known allergies.  Review of Systems   Review of Systems  Constitutional: Negative for activity change.  Musculoskeletal: Negative for arthralgias, back pain, joint swelling and neck pain.  Skin: Negative for wound.  Neurological: Positive for weakness (Questionable, mild) and numbness.    Physical Exam Updated Vital Signs BP (!) 117/93   Pulse 78   Temp 98.4 F (36.9 C)   Resp 16   Ht 5' 10.5" (1.791 m)   Wt 65.3 kg   LMP 06/23/2020   SpO2 100%   BMI 20.37 kg/m   Physical Exam Vitals and nursing note reviewed.  Constitutional:      Appearance: She is well-developed.  HENT:     Head: Normocephalic and atraumatic.  Eyes:     Conjunctiva/sclera: Conjunctivae normal.  Pulmonary:     Effort: No respiratory distress.  Musculoskeletal:     Right hand: No swelling, deformity, tenderness or bony tenderness. Normal range of motion. Decreased strength of wrist extension (4+/5 wrist extension compared to right). Normal strength of finger abduction and thumb/finger opposition. Decreased sensation of the radial distribution. Normal sensation of the ulnar distribution  and median distribution. Normal capillary refill (2-3s). Normal pulse (2+ radial in wrist).     Left hand: Normal strength. Normal capillary refill (2-3s). Normal pulse (2+ radial in wrist).     Cervical back: Normal range of motion and neck supple.  Skin:    General: Skin is warm and dry.  Neurological:     Mental Status: She is alert.     ED Results / Procedures / Treatments   Labs (all labs ordered are listed, but only abnormal results are displayed) Labs Reviewed - No data to display  EKG None  Radiology No results found.  Procedures Procedures (including critical care time)  Medications Ordered in ED Medications - No data to display  ED Course  I have reviewed the  triage vital signs and the nursing notes.  Pertinent labs & imaging results that were available during my care of the patient were reviewed by me and considered in my medical decision making (see chart for details).  Patient seen and examined.  Encouraged rest of the rest, orthopedic follow-up next week if not improved.  Patient states that she is not working over the holiday weekend.  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  No indications for x-rays at this time.  Patient counseled on expectant management, rest.  Sports medicine referral given in case no improvement for reevaluation next week as needed.   Final Clinical Impression(s) / ED Diagnoses Final diagnoses:  Right hand paresthesia    Rx / DC Orders ED Discharge Orders    None       Renne Crigler, Cordelia Poche 06/27/20 1952    Jacalyn Lefevre, MD 06/27/20 2035

## 2022-02-16 ENCOUNTER — Encounter (HOSPITAL_BASED_OUTPATIENT_CLINIC_OR_DEPARTMENT_OTHER): Payer: Self-pay | Admitting: *Deleted

## 2022-02-16 ENCOUNTER — Emergency Department (HOSPITAL_BASED_OUTPATIENT_CLINIC_OR_DEPARTMENT_OTHER): Payer: Self-pay

## 2022-02-16 ENCOUNTER — Other Ambulatory Visit: Payer: Self-pay

## 2022-02-16 ENCOUNTER — Emergency Department (HOSPITAL_BASED_OUTPATIENT_CLINIC_OR_DEPARTMENT_OTHER)
Admission: EM | Admit: 2022-02-16 | Discharge: 2022-02-17 | Disposition: A | Discharge status: Home / Self Care | Payer: Self-pay | Attending: Emergency Medicine | Admitting: Emergency Medicine

## 2022-02-16 DIAGNOSIS — B9689 Other specified bacterial agents as the cause of diseases classified elsewhere: Secondary | ICD-10-CM

## 2022-02-16 DIAGNOSIS — R197 Diarrhea, unspecified: Secondary | ICD-10-CM | POA: Insufficient documentation

## 2022-02-16 DIAGNOSIS — A599 Trichomoniasis, unspecified: Secondary | ICD-10-CM | POA: Insufficient documentation

## 2022-02-16 DIAGNOSIS — A5901 Trichomonal vulvovaginitis: Secondary | ICD-10-CM | POA: Insufficient documentation

## 2022-02-16 DIAGNOSIS — R102 Pelvic and perineal pain: Secondary | ICD-10-CM | POA: Insufficient documentation

## 2022-02-16 DIAGNOSIS — R112 Nausea with vomiting, unspecified: Secondary | ICD-10-CM | POA: Insufficient documentation

## 2022-02-16 DIAGNOSIS — R42 Dizziness and giddiness: Secondary | ICD-10-CM | POA: Insufficient documentation

## 2022-02-16 DIAGNOSIS — N760 Acute vaginitis: Secondary | ICD-10-CM | POA: Insufficient documentation

## 2022-02-16 LAB — URINALYSIS, MICROSCOPIC (REFLEX)

## 2022-02-16 LAB — URINALYSIS, ROUTINE W REFLEX MICROSCOPIC
Bilirubin Urine: NEGATIVE
Glucose, UA: NEGATIVE mg/dL
Hgb urine dipstick: NEGATIVE
Ketones, ur: NEGATIVE mg/dL
Nitrite: NEGATIVE
Protein, ur: NEGATIVE mg/dL
Specific Gravity, Urine: >1.03 — ABNORMAL HIGH (ref 1.005–1.030)
pH: 5.5 (ref 5.0–8.0)

## 2022-02-16 LAB — COMPREHENSIVE METABOLIC PANEL
ALT: 14 U/L (ref 0–44)
AST: 16 U/L (ref 15–41)
Albumin: 4.2 g/dL (ref 3.5–5.0)
Alkaline Phosphatase: 38 U/L (ref 38–126)
Anion gap: 9 (ref 5–15)
BUN: 16 mg/dL (ref 6–20)
CO2: 23 mmol/L (ref 22–32)
Calcium: 9.3 mg/dL (ref 8.9–10.3)
Chloride: 102 mmol/L (ref 98–111)
Creatinine, Ser: 0.92 mg/dL (ref 0.44–1.00)
GFR, Estimated: >60 mL/min (ref >60)
Glucose, Bld: 92 mg/dL (ref 70–99)
Potassium: 3.7 mmol/L (ref 3.5–5.1)
Sodium: 134 mmol/L — ABNORMAL LOW (ref 135–145)
Total Bilirubin: 0.6 mg/dL (ref 0.3–1.2)
Total Protein: 7.6 g/dL (ref 6.5–8.1)

## 2022-02-16 LAB — WET PREP, GENITAL
Sperm: NONE SEEN
WBC, Wet Prep HPF POC: >=10 — AB (ref <10)
Yeast Wet Prep HPF POC: NONE SEEN

## 2022-02-16 LAB — CBC WITH DIFFERENTIAL/PLATELET
Abs Immature Granulocytes: 0.01 10*3/uL (ref 0.00–0.07)
Basophils Absolute: 0 10*3/uL (ref 0.0–0.1)
Basophils Relative: 1 %
Eosinophils Absolute: 0.1 10*3/uL (ref 0.0–0.5)
Eosinophils Relative: 1 %
HCT: 38.1 % (ref 36.0–46.0)
Hemoglobin: 12.7 g/dL (ref 12.0–15.0)
Immature Granulocytes: 0 %
Lymphocytes Relative: 29 %
Lymphs Abs: 1.8 10*3/uL (ref 0.7–4.0)
MCH: 32.2 pg (ref 26.0–34.0)
MCHC: 33.3 g/dL (ref 30.0–36.0)
MCV: 96.5 fL (ref 80.0–100.0)
Monocytes Absolute: 0.7 10*3/uL (ref 0.1–1.0)
Monocytes Relative: 11 %
Neutro Abs: 3.6 10*3/uL (ref 1.7–7.7)
Neutrophils Relative %: 58 %
Platelets: 205 10*3/uL (ref 150–400)
RBC: 3.95 MIL/uL (ref 3.87–5.11)
RDW: 13 % (ref 11.5–15.5)
WBC: 6.1 10*3/uL (ref 4.0–10.5)
nRBC: 0 % (ref 0.0–0.2)

## 2022-02-16 LAB — PREGNANCY, URINE: Preg Test, Ur: NEGATIVE

## 2022-02-16 MED ORDER — METRONIDAZOLE 500 MG PO TABS: 500.0000 mg | ORAL_TABLET | Freq: Two times a day (BID) | ORAL | 0 refills | Status: AC

## 2022-02-16 MED ORDER — ONDANSETRON HCL 4 MG/2ML IJ SOLN
4.0000 mg | Freq: Once | INTRAMUSCULAR | Status: AC
  Administered 2022-02-16: 4 mg via INTRAVENOUS
  Filled 2022-02-16: qty 2

## 2022-02-16 MED ORDER — ONDANSETRON HCL 4 MG PO TABS: 4.0000 mg | ORAL_TABLET | Freq: Four times a day (QID) | ORAL | 0 refills | Status: DC

## 2022-02-16 MED ORDER — SODIUM CHLORIDE 0.9 % IV BOLUS
1000.0000 mL | Freq: Once | INTRAVENOUS | Status: AC
  Administered 2022-02-16: 1000 mL via INTRAVENOUS

## 2022-02-16 NOTE — ED Provider Notes (Signed)
° °  Pt is a 41 yo female presenting for pelvic pain. GU exam demo discharge. Positive for BV and trich. Pending transvaginal US to DC.    Physical Exam  BP 124/82    Pulse (!) 51    Temp 98 F (36.7 C) (Oral)    Resp 16    Ht 5' 10.5" (1.791 m)    Wt 63.5 kg    LMP 02/08/2022    SpO2 98%    BMI 19.80 kg/m   Physical Exam Vitals and nursing note reviewed.  Constitutional:      General: She is not in acute distress.    Appearance: She is well-developed.  HENT:     Head: Normocephalic and atraumatic.  Eyes:     Conjunctiva/sclera: Conjunctivae normal.  Cardiovascular:     Rate and Rhythm: Normal rate and regular rhythm.     Heart sounds: No murmur heard. Pulmonary:     Effort: Pulmonary effort is normal. No respiratory distress.     Breath sounds: Normal breath sounds.  Abdominal:     Palpations: Abdomen is soft.     Tenderness: There is abdominal tenderness in the suprapubic area.  Musculoskeletal:        General: No swelling.     Cervical back: Neck supple.  Skin:    General: Skin is warm and dry.     Capillary Refill: Capillary refill takes less than 2 seconds.  Neurological:     Mental Status: She is alert.  Psychiatric:        Mood and Affect: Mood normal.    Procedures  Procedures  ED Course / MDM    Medical Decision Making Amount and/or Complexity of Data Reviewed Labs: ordered. Radiology: ordered. ECG/medicine tests: ordered.  Risk Prescription drug management.   US demonstrates no acute process. Prescriptions for metronidazole provided for trich and BV. Patient in no distress and overall condition improved here in the ED. Detailed discussions were had with the patient regarding current findings, and need for close f/u with PCP or on call doctor. The patient has been instructed to return immediately if the symptoms worsen in any way for re-evaluation. Patient verbalized understanding and is in agreement with current care plan. All questions answered prior to  discharge.        Edwin Dada P, DO 02/17/22 [phone]

## 2022-02-16 NOTE — Discharge Instructions (Signed)
Follow-up with your OB/GYN or primary care doctor if your symptoms are not improving in the next few days.  Return to the emergency room if you have any worsening symptoms.

## 2022-02-16 NOTE — ED Triage Notes (Signed)
Abdominal pain and dizziness x 2 weeks. She passed out 2 weeks ago and hit her forehead on her couch. Pt is ambulatory, alert oriented.

## 2022-02-16 NOTE — ED Provider Notes (Signed)
[facility] Provider Note   CSN: [id number] Arrival date & time: 02/16/22  1732     History  Chief Complaint  Patient presents with   Abdominal Pain   Dizziness    [facility] is a 41 y.o. female.  Patient is a 41 year old female who presents with abdominal pain.  She reports a 2-week history of lower abdominal pain.  She denies any urinary symptoms.  She has had some associated nausea and some intermittent vomiting.  She has had some loose stools but she says she thinks that may have been because she ate some tacos yesterday.  Otherwise her stools have been normal.  She denies any vaginal bleeding or discharge.  She has had some intermittent dizziness.  Seems to be mostly when she stands up too fast.  She did have a near syncopal event 2 weeks ago and hit her forehead on the couch.  No worsening headaches.      Home Medications Prior to Admission medications   Medication Sig Start Date End Date Taking? Authorizing Provider  metroNIDAZOLE (FLAGYL) 500 MG tablet Take 1 tablet (500 mg total) by mouth 2 (two) times daily. One po bid x 7 days 02/16/22  Yes Malvin Johns, MD  ondansetron (ZOFRAN) 4 MG tablet Take 1 tablet (4 mg total) by mouth every 6 (six) hours. 02/16/22  Yes Malvin Johns, MD  ondansetron (ZOFRAN ODT) 8 MG disintegrating tablet Take 1 tablet (8 mg total) by mouth every 8 (eight) hours as needed for nausea or vomiting. 02/05/20   Lucrezia Starch, MD      Allergies    Penicillins    Review of Systems   Review of Systems  Constitutional:  Negative for chills, diaphoresis, fatigue and fever.  HENT:  Negative for congestion, rhinorrhea and sneezing.   Eyes: Negative.   Respiratory:  Negative for cough, chest tightness and shortness of breath.   Cardiovascular:  Negative for chest pain and leg swelling.  Gastrointestinal:  Positive for abdominal pain, nausea and vomiting. Negative for blood in stool and diarrhea.  Genitourinary:   Negative for difficulty urinating, flank pain, frequency and hematuria.  Musculoskeletal:  Negative for arthralgias and back pain.  Skin:  Negative for rash.  Neurological:  Positive for dizziness, light-headedness and headaches. Negative for speech difficulty, weakness and numbness.   Physical Exam Updated Vital Signs BP 124/82    Pulse (!) 51    Temp 98 F (36.7 C) (Oral)    Resp 16    Ht 5' 10.5" (1.791 m)    Wt 63.5 kg    LMP 02/08/2022    SpO2 98%    BMI 19.80 kg/m  Physical Exam Constitutional:      Appearance: She is well-developed.  HENT:     Head: Normocephalic and atraumatic.  Eyes:     Pupils: Pupils are equal, round, and reactive to light.  Cardiovascular:     Rate and Rhythm: Normal rate and regular rhythm.     Heart sounds: Normal heart sounds.  Pulmonary:     Effort: Pulmonary effort is normal. No respiratory distress.     Breath sounds: Normal breath sounds. No wheezing or rales.  Chest:     Chest wall: No tenderness.  Abdominal:     General: Bowel sounds are normal.     Palpations: Abdomen is soft.     Tenderness: There is abdominal tenderness in the right lower quadrant, suprapubic area and left lower quadrant. There is no guarding  or rebound.  Genitourinary:    Comments: Large amount of thin white discharge, there is some left adnexal tenderness, no cervical motion tenderness Musculoskeletal:        General: Normal range of motion.     Cervical back: Normal range of motion and neck supple.  Lymphadenopathy:     Cervical: No cervical adenopathy.  Skin:    General: Skin is warm and dry.     Findings: No rash.  Neurological:     Mental Status: She is alert and oriented to person, place, and time.    ED Results / Procedures / Treatments   Labs (all labs ordered are listed, but only abnormal results are displayed) Labs Reviewed  WET PREP, GENITAL - Abnormal; Notable for the following components:      Result Value   Trich, Wet Prep PRESENT (*)    Clue  Cells Wet Prep HPF POC PRESENT (*)    WBC, Wet Prep HPF POC >=10 (*)    All other components within normal limits  URINALYSIS, ROUTINE W REFLEX MICROSCOPIC - Abnormal; Notable for the following components:   APPearance CLOUDY (*)    Specific Gravity, Urine >1.030 (*)    Leukocytes,Ua TRACE (*)    All other components within normal limits  COMPREHENSIVE METABOLIC PANEL - Abnormal; Notable for the following components:   Sodium 134 (*)    All other components within normal limits  URINALYSIS, MICROSCOPIC (REFLEX) - Abnormal; Notable for the following components:   Bacteria, UA MANY (*)    Trichomonas, UA PRESENT (*)    All other components within normal limits  CBC WITH DIFFERENTIAL/PLATELET  PREGNANCY, URINE  GC/CHLAMYDIA PROBE AMP ([place]) NOT AT [facility]    EKG EKG Interpretation  Date/Time:  Tuesday February 16 2022 17:55:03 EST Ventricular Rate:  67 PR Interval:  128 QRS Duration: 88 QT Interval:  400 QTC Calculation: 422 R Axis:   88 Text Interpretation: Normal sinus rhythm with sinus arrhythmia Possible Anterior infarct , age undetermined Abnormal ECG When compared with ECG of 17-Oct-2015 15:44, PREVIOUS ECG IS PRESENT since last tracing no significant change Confirmed by Malvin Johns [phone]) on 02/16/2022 8:54:40 PM  Radiology No results found.  Procedures Procedures    Medications Ordered in ED Medications  sodium chloride 0.9 % bolus 1,000 mL (0 mLs Intravenous Stopped 02/16/22 2318)  ondansetron (ZOFRAN) injection 4 mg (4 mg Intravenous Given 02/16/22 2118)    ED Course/ Medical Decision Making/ A&P                           Medical Decision Making Amount and/or Complexity of Data Reviewed Labs: ordered. Radiology: ordered. ECG/medicine tests: ordered.  Risk Prescription drug management.   Patient is a 41 year old female who presents with lower abdominal/pelvic pain and some nausea associated with some lightheadedness.  Her pain seems to be more  pelvic in nature.  On pelvic exam she had a large amount of discharge and some left adnexal tenderness.  Her labs are nonconcerning.  These were interpreted by me.  Her pregnancy test is negative.  Her urine does not appear to be consistent with infection.  Her wet prep is positive for trichomonas and BV.  She was given IV fluids and Zofran and is feeling much better.  She no longer has any dizziness.  She does not have any focal neurologic deficits.  Her EKG does not show any signs of arrhythmias.  This was interpreted by me.  She is awaiting a pelvic ultrasound.  Care turned over to Dr. Pearline Cables pending the ultrasound.  If nonconcerning, she can be discharged with Flagyl for treatment of her pelvic infection.  Final Clinical Impression(s) / ED Diagnoses Final diagnoses:  Pelvic pain  BV (bacterial vaginosis)  Trichimoniasis    Rx / DC Orders ED Discharge Orders          Ordered    metroNIDAZOLE (FLAGYL) 500 MG tablet  2 times daily        02/16/22 2342    ondansetron (ZOFRAN) 4 MG tablet  Every 6 hours        02/16/22 2342              Malvin Johns, MD 02/16/22 2344

## 2022-02-17 NOTE — ED Notes (Signed)
Patient verbalizes understanding of discharge instructions. Opportunity for questioning and answers were provided. Armband removed by staff, pt discharged from ED. Ambulated out to lobby  

## 2022-02-18 ENCOUNTER — Telehealth (HOSPITAL_COMMUNITY): Payer: Self-pay

## 2022-02-18 LAB — GC/CHLAMYDIA PROBE AMP (~~LOC~~) NOT AT ARMC
Chlamydia: POSITIVE — AB
Comment: NEGATIVE
Comment: NORMAL
Neisseria Gonorrhea: NEGATIVE

## 2022-02-18 MED ORDER — DOXYCYCLINE HYCLATE 100 MG PO CAPS: 100.0000 mg | ORAL_CAPSULE | Freq: Two times a day (BID) | ORAL | 0 refills | Status: AC

## 2022-04-07 ENCOUNTER — Emergency Department (HOSPITAL_BASED_OUTPATIENT_CLINIC_OR_DEPARTMENT_OTHER)
Admission: EM | Admit: 2022-04-07 | Discharge: 2022-04-07 | Disposition: A | Discharge status: Home / Self Care | Payer: 59 | Attending: Emergency Medicine | Admitting: Emergency Medicine

## 2022-04-07 ENCOUNTER — Other Ambulatory Visit: Payer: Self-pay

## 2022-04-07 ENCOUNTER — Emergency Department (HOSPITAL_BASED_OUTPATIENT_CLINIC_OR_DEPARTMENT_OTHER): Payer: 59

## 2022-04-07 ENCOUNTER — Encounter (HOSPITAL_BASED_OUTPATIENT_CLINIC_OR_DEPARTMENT_OTHER): Payer: Self-pay

## 2022-04-07 DIAGNOSIS — N898 Other specified noninflammatory disorders of vagina: Secondary | ICD-10-CM | POA: Insufficient documentation

## 2022-04-07 DIAGNOSIS — R102 Pelvic and perineal pain: Secondary | ICD-10-CM | POA: Insufficient documentation

## 2022-04-07 DIAGNOSIS — R197 Diarrhea, unspecified: Secondary | ICD-10-CM | POA: Insufficient documentation

## 2022-04-07 DIAGNOSIS — R110 Nausea: Secondary | ICD-10-CM | POA: Insufficient documentation

## 2022-04-07 DIAGNOSIS — R1012 Left upper quadrant pain: Secondary | ICD-10-CM | POA: Insufficient documentation

## 2022-04-07 LAB — COMPREHENSIVE METABOLIC PANEL
ALT: 31 U/L (ref 0–44)
AST: 38 U/L (ref 15–41)
Albumin: 4.4 g/dL (ref 3.5–5.0)
Alkaline Phosphatase: 45 U/L (ref 38–126)
Anion gap: 12 (ref 5–15)
BUN: 9 mg/dL (ref 6–20)
CO2: 26 mmol/L (ref 22–32)
Calcium: 9.2 mg/dL (ref 8.9–10.3)
Chloride: 99 mmol/L (ref 98–111)
Creatinine, Ser: 1 mg/dL (ref 0.44–1.00)
GFR, Estimated: >60 mL/min (ref >60)
Glucose, Bld: 71 mg/dL (ref 70–99)
Potassium: 3.9 mmol/L (ref 3.5–5.1)
Sodium: 137 mmol/L (ref 135–145)
Total Bilirubin: 1 mg/dL (ref 0.3–1.2)
Total Protein: 7.9 g/dL (ref 6.5–8.1)

## 2022-04-07 LAB — CBC
HCT: 38.6 % (ref 36.0–46.0)
Hemoglobin: 13 g/dL (ref 12.0–15.0)
MCH: 32.3 pg (ref 26.0–34.0)
MCHC: 33.7 g/dL (ref 30.0–36.0)
MCV: 95.8 fL (ref 80.0–100.0)
Platelets: 195 10*3/uL (ref 150–400)
RBC: 4.03 MIL/uL (ref 3.87–5.11)
RDW: 14.3 % (ref 11.5–15.5)
WBC: 5.1 10*3/uL (ref 4.0–10.5)
nRBC: 0 % (ref 0.0–0.2)

## 2022-04-07 LAB — URINALYSIS, ROUTINE W REFLEX MICROSCOPIC
Bilirubin Urine: NEGATIVE
Glucose, UA: NEGATIVE mg/dL
Hgb urine dipstick: NEGATIVE
Ketones, ur: NEGATIVE mg/dL
Leukocytes,Ua: NEGATIVE
Nitrite: NEGATIVE
Protein, ur: NEGATIVE mg/dL
Specific Gravity, Urine: >=1.03 (ref 1.005–1.030)
pH: 5.5 (ref 5.0–8.0)

## 2022-04-07 LAB — PREGNANCY, URINE: Preg Test, Ur: NEGATIVE

## 2022-04-07 LAB — WET PREP, GENITAL
Clue Cells Wet Prep HPF POC: NONE SEEN
Sperm: NONE SEEN
Trich, Wet Prep: NONE SEEN
WBC, Wet Prep HPF POC: <10 (ref <10)
Yeast Wet Prep HPF POC: NONE SEEN

## 2022-04-07 LAB — LIPASE, BLOOD: Lipase: 46 U/L (ref 11–51)

## 2022-04-07 MED ORDER — ONDANSETRON 4 MG PO TBDP
4.0000 mg | ORAL_TABLET | Freq: Once | ORAL | Status: AC
  Administered 2022-04-07: 4 mg via ORAL
  Filled 2022-04-07: qty 1

## 2022-04-07 MED ORDER — ONDANSETRON HCL 4 MG/2ML IJ SOLN: 4.0000 mg | Freq: Once | INTRAMUSCULAR | Status: DC

## 2022-04-07 MED ORDER — ONDANSETRON HCL 4 MG PO TABS: 8.0000 mg | ORAL_TABLET | Freq: Three times a day (TID) | ORAL | 0 refills | Status: AC | PRN

## 2022-04-07 NOTE — Discharge Instructions (Signed)
I have refilled your zofran prescription. ?Your trichomonas, bacterial vaginosis, and yeast were negative. You have a pending repeat gonorrhea and chlamydia. Please follow up on these results and seek treatment if positive.  ?You should schedule an appointment with GI for your diarrhea and abdominal pains.  ?If you get acutely worse, please return to the ED.  ?

## 2022-04-07 NOTE — ED Provider Notes (Signed)
?[facility] DEPARTMENT ?Provider Note ? ? ?CSN: [id number] ?Arrival date & time: 04/07/22  1141 ? ?  ? ?History ?PMH: recent trichomonas and chlamydial infection, HTN ?Chief Complaint  ?Patient presents with  ? Abdominal Pain  ? ? ?[facility] is a 41 y.o. female.  Patient presents to the emergency department with a chief complaint of abdominal pain, nausea, and diarrhea.  She says that she was seen here 3 weeks ago with a complaint of abdominal pain and nausea.  At that time, she was diagnosed with trichomonas and chlamydia infection.  She received doxycycline and Flagyl and took the medications appropriately.  She never felt like she got any better.  She continues to have the left upper quadrant and pelvic pain.  She says she started having the diarrhea about 2 weeks ago.  She says initially the diarrhea was bloody, however now it is just clear.  She says she has this every day.  She denies any fevers or chills.  She states that she continue to have vaginal discharge that is clear in color.  ? ? ?Abdominal Pain ?Associated symptoms: diarrhea, nausea and vaginal discharge   ? ?  ? ?Home Medications ?Prior to Admission medications   ?Medication Sig Start Date End Date Taking? Authorizing Provider  ?ondansetron (ZOFRAN) 4 MG tablet Take 2 tablets (8 mg total) by mouth every 8 (eight) hours as needed for up to 5 days for nausea or vomiting. 04/07/22 04/12/22 Yes Jamile Rekowski, Adora Fridge, PA-C  ?metroNIDAZOLE (FLAGYL) 500 MG tablet Take 1 tablet (500 mg total) by mouth 2 (two) times daily. One po bid x 7 days 02/16/22   Malvin Johns, MD  ?ondansetron (ZOFRAN ODT) 8 MG disintegrating tablet Take 1 tablet (8 mg total) by mouth every 8 (eight) hours as needed for nausea or vomiting. 02/05/20   Lucrezia Starch, MD  ?   ? ?Allergies    ?Penicillins   ? ?Review of Systems   ?Review of Systems  ?Gastrointestinal:  Positive for abdominal pain, diarrhea and nausea.  ?Genitourinary:  Positive for pelvic pain and  vaginal discharge.  ?All other systems reviewed and are negative. ? ?Physical Exam ?Updated Vital Signs ?BP (!) 125/95   Pulse (!) 55   Temp 98.2 ?F (36.8 ?C) (Oral)   Resp 17   Ht 5' 10.5" (1.791 m)   Wt 68 kg   LMP 03/08/2022 (Approximate)   SpO2 100%   BMI 21.22 kg/m?  ?Physical Exam ?Vitals and nursing note reviewed.  ?Constitutional:   ?   General: She is not in acute distress. ?   Appearance: Normal appearance. She is not ill-appearing, toxic-appearing or diaphoretic.  ?HENT:  ?   Head: Normocephalic and atraumatic.  ?   Nose: No nasal deformity.  ?   Mouth/Throat:  ?   Lips: Pink. No lesions.  ?   Mouth: Mucous membranes are moist. No injury, lacerations, oral lesions or angioedema.  ?   Pharynx: Oropharynx is clear. Uvula midline. No pharyngeal swelling, oropharyngeal exudate, posterior oropharyngeal erythema or uvula swelling.  ?Eyes:  ?   General: Gaze aligned appropriately. No scleral icterus.    ?   Right eye: No discharge.     ?   Left eye: No discharge.  ?   Conjunctiva/sclera: Conjunctivae normal.  ?   Right eye: Right conjunctiva is not injected. No exudate or hemorrhage. ?   Left eye: Left conjunctiva is not injected. No exudate or hemorrhage. ?   Pupils: Pupils  are equal, round, and reactive to light.  ?Cardiovascular:  ?   Rate and Rhythm: Normal rate and regular rhythm.  ?   Pulses: Normal pulses.     ?     Radial pulses are 2+ on the right side and 2+ on the left side.  ?     Dorsalis pedis pulses are 2+ on the right side and 2+ on the left side.  ?   Heart sounds: Normal heart sounds, S1 normal and S2 normal. Heart sounds not distant. No murmur heard. ?  No friction rub. No gallop. No S3 or S4 sounds.  ?Pulmonary:  ?   Effort: Pulmonary effort is normal. No accessory muscle usage or respiratory distress.  ?   Breath sounds: Normal breath sounds. No stridor. No wheezing, rhonchi or rales.  ?Chest:  ?   Chest wall: No tenderness.  ?Abdominal:  ?   General: Abdomen is flat. There is no  distension.  ?   Palpations: Abdomen is soft. There is no mass or pulsatile mass.  ?   Tenderness: There is abdominal tenderness in the suprapubic area and left upper quadrant. There is guarding. There is no right CVA tenderness, left CVA tenderness or rebound. Negative signs include Murphy's sign, Rovsing's sign and McBurney's sign.  ?   Comments: Voluntary guarding in suprapubic region  ?Musculoskeletal:  ?   Right lower leg: No edema.  ?   Left lower leg: No edema.  ?Skin: ?   General: Skin is warm and dry.  ?   Coloration: Skin is not jaundiced or pale.  ?   Findings: No bruising, erythema, lesion or rash.  ?Neurological:  ?   General: No focal deficit present.  ?   Mental Status: She is alert and oriented to person, place, and time.  ?   GCS: GCS eye subscore is 4. GCS verbal subscore is 5. GCS motor subscore is 6.  ?Psychiatric:     ?   Mood and Affect: Mood normal.     ?   Behavior: Behavior normal. Behavior is cooperative.  ? ? ?ED Results / Procedures / Treatments   ?Labs ?(all labs ordered are listed, but only abnormal results are displayed) ?Labs Reviewed  ?URINALYSIS, ROUTINE W REFLEX MICROSCOPIC - Abnormal; Notable for the following components:  ?    Result Value  ? Color, Urine AMBER (*)   ? All other components within normal limits  ?WET PREP, GENITAL  ?LIPASE, BLOOD  ?COMPREHENSIVE METABOLIC PANEL  ?CBC  ?PREGNANCY, URINE  ?GC/CHLAMYDIA PROBE AMP ([place]) NOT AT [facility]  ? ? ?EKG ?None ? ?Radiology ?US PELVIC COMPLETE W TRANSVAGINAL AND TORSION R/O ? ?Result Date: 04/07/2022 ?CLINICAL DATA:  41 year old female with pelvic pain and recent STD treatment. LMP 03/08/2022. EXAM: TRANSABDOMINAL AND TRANSVAGINAL ULTRASOUND OF PELVIS DOPPLER ULTRASOUND OF OVARIES TECHNIQUE: Both transabdominal and transvaginal ultrasound examinations of the pelvis were performed. Transabdominal technique was performed for global imaging of the pelvis including uterus, ovaries, adnexal regions, and pelvic cul-de-sac. It was  necessary to proceed with endovaginal exam following the transabdominal exam to visualize the endometrium and adnexa. Color and duplex Doppler ultrasound was utilized to evaluate blood flow to the ovaries. COMPARISON:  11/05/2027 pelvic sonogram FINDINGS: Uterus Measurements: 6.2 x 3.7 x 4.1 cm = volume: 48 mL. Anteverted uterus is normal in size and configuration, with no uterine fibroids or other myometrial abnormalities. Endometrium Thickness: 6 mm. No endometrial cavity fluid or focal endometrial mass. Right ovary Measurements: 3.8 x  2.5 x 2.8 cm = volume: 14.1 mL. Right ovary 1.9 x 1.7 x 1.8 cm cyst with low level eccentric heterogeneous internal echoes and no convincing internal vascularity, compatible with a hemorrhagic right ovarian cyst. Separate simple 1.5 x 1.5 x 1.5 cm right ovarian cyst. No suspicious right ovarian or right adnexal masses. Left ovary Measurements: 2.9 x 0.9 x 0.9 cm = volume: 1.2 mL. Normal appearance/no adnexal mass. Pulsed Doppler evaluation of both ovaries demonstrates normal low-resistance arterial and venous waveforms. Other findings No abnormal free fluid. IMPRESSION: 1. No evidence of adnexal torsion. 2. No suspicious adnexal masses. Right ovarian 1.9 cm hemorrhagic cyst and separate simple 1.5 cm right ovarian follicular cyst. No follow-up recommended. 3. Normal uterus. Electronically Signed   By: Ilona Sorrel M.D.   On: 04/07/2022 15:28   ? ?Procedures ?Procedures  ? ? ?Medications Ordered in ED ?Medications  ?ondansetron (ZOFRAN-ODT) disintegrating tablet 4 mg (4 mg Oral Given 04/07/22 1434)  ? ? ?ED Course/ Medical Decision Making/ A&P ?  ?                        ?Medical Decision Making ?Amount and/or Complexity of Data Reviewed ?Labs: ordered. ?Radiology: ordered. ? ?Risk ?Prescription drug management. ? ? ? ?MDM  ?This is a 41 y.o. female who presents to the ED with pelvic pain, upper abdominal pain, nausea, and diarrhea ? ?My Impression, Plan, and ED Course: Patient  appears well.  Normal vitals.  Sounds like she was seen here back in the end of February and had a confirmed diagnosis of chlamydia and trichomonas.  She reportedly finished the treatment for this but her symptoms ne

## 2022-04-07 NOTE — ED Notes (Signed)
ED Provider at bedside. 

## 2022-04-07 NOTE — ED Triage Notes (Signed)
C/o abdominal pain/nausea/ diarrhea x 3 weeks.  ?

## 2022-04-07 NOTE — ED Notes (Signed)
Pelvic cart outside of room ?

## 2022-04-08 LAB — GC/CHLAMYDIA PROBE AMP (~~LOC~~) NOT AT ARMC
Chlamydia: NEGATIVE
Comment: NEGATIVE
Comment: NORMAL
Neisseria Gonorrhea: NEGATIVE

## 2022-04-16 ENCOUNTER — Other Ambulatory Visit: Payer: Self-pay | Admitting: Gastroenterology

## 2022-04-16 DIAGNOSIS — R1011 Right upper quadrant pain: Secondary | ICD-10-CM

## 2022-04-19 ENCOUNTER — Inpatient Hospital Stay: Admission: RE | Admit: 2022-04-19 | Payer: 59 | Source: Ambulatory Visit

## 2022-04-23 ENCOUNTER — Ambulatory Visit
Admission: RE | Admit: 2022-04-23 | Discharge: 2022-04-23 | Disposition: A | Discharge status: Home / Self Care | Payer: 59 | Source: Ambulatory Visit | Attending: Gastroenterology | Admitting: Gastroenterology

## 2022-04-23 DIAGNOSIS — R1011 Right upper quadrant pain: Secondary | ICD-10-CM

## 2023-10-14 IMAGING — US US PELVIS COMPLETE TRANSABD/TRANSVAG W DUPLEX AND/OR DOPPLER
1 series · 13 of 25 positions shown · non-contrast
Comparison: 11/05/19

CLINICAL DATA: Left-sided pelvic pain

EXAM:
TRANSABDOMINAL AND TRANSVAGINAL ULTRASOUND OF PELVIS
DOPPLER ULTRASOUND OF OVARIES
TECHNIQUE: Both transabdominal and transvaginal ultrasound examinations of the
pelvis were performed. Transabdominal technique was performed for
global imaging of the pelvis including uterus, ovaries, adnexal
regions, and pelvic cul-de-sac.
It was necessary to proceed with endovaginal exam following the
transabdominal exam to visualize the ovaries. Color and duplex
Doppler ultrasound was utilized to evaluate blood flow to the
ovaries.

[Series 1: us pelvis complete transabd/transvag w duplex and/ · 13 of 98 slices shown]
[im 1/98]
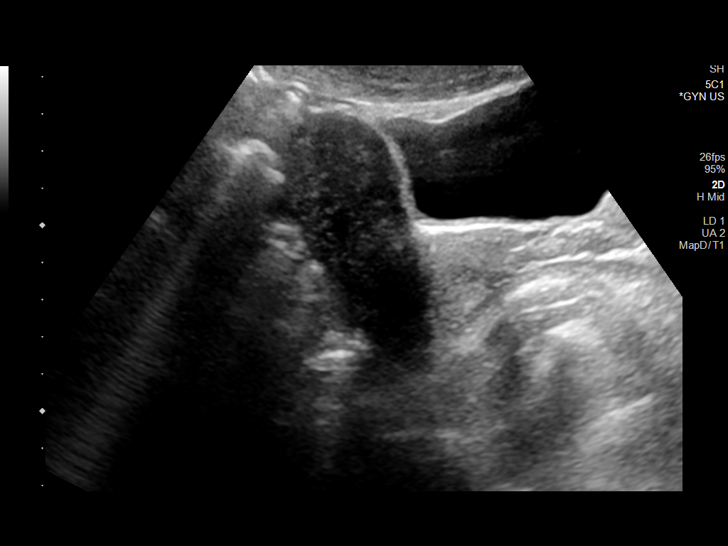
[im 9/98]
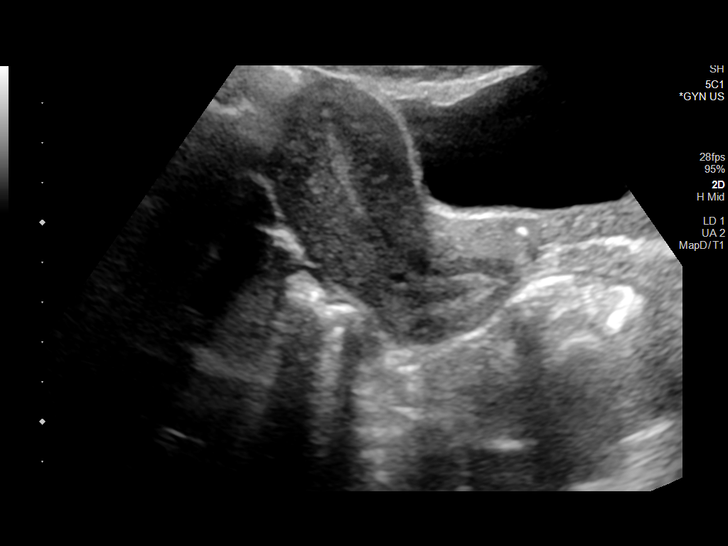
[im 17/98]
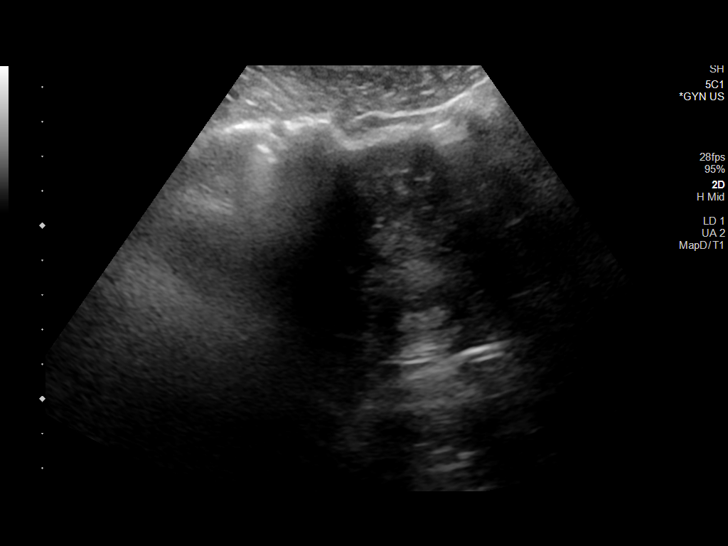
[im 25/98]
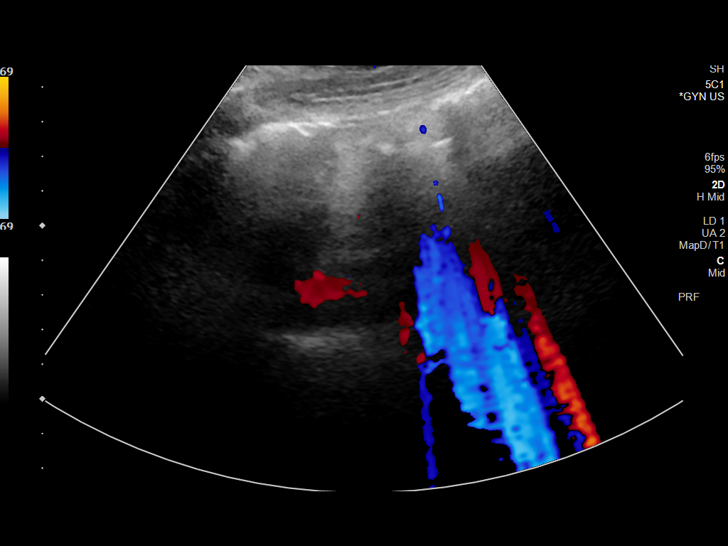
[im 33/98]
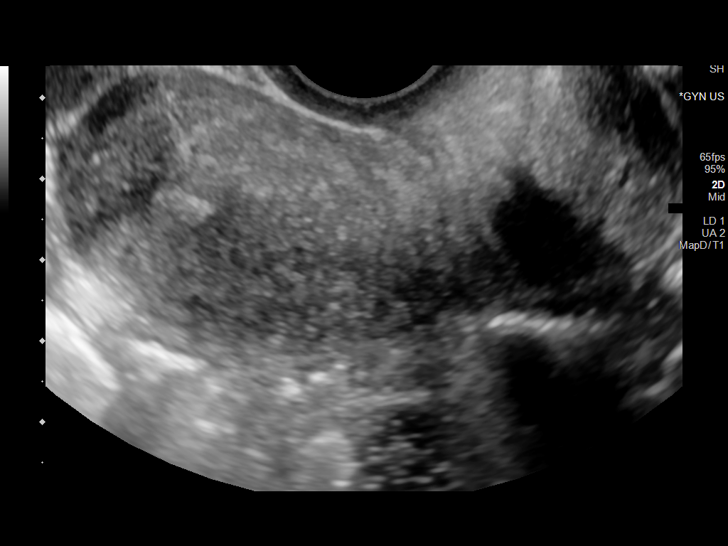
[im 41/98]
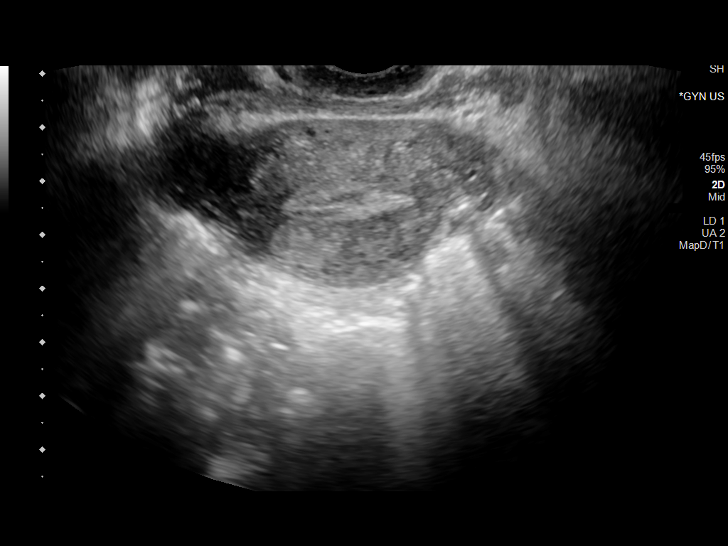
[im 49/98]
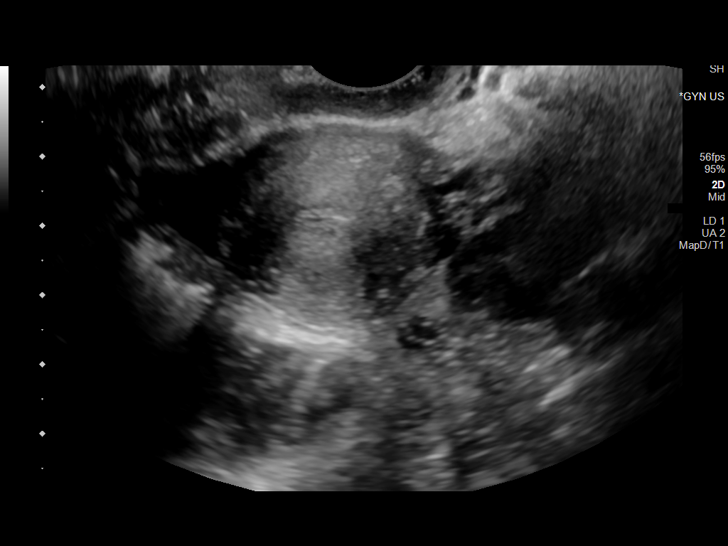
[im 57/98]
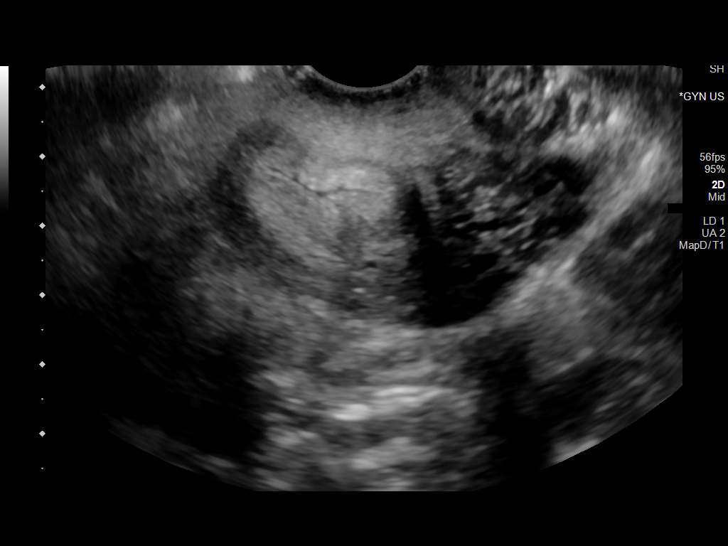
[im 65/98]
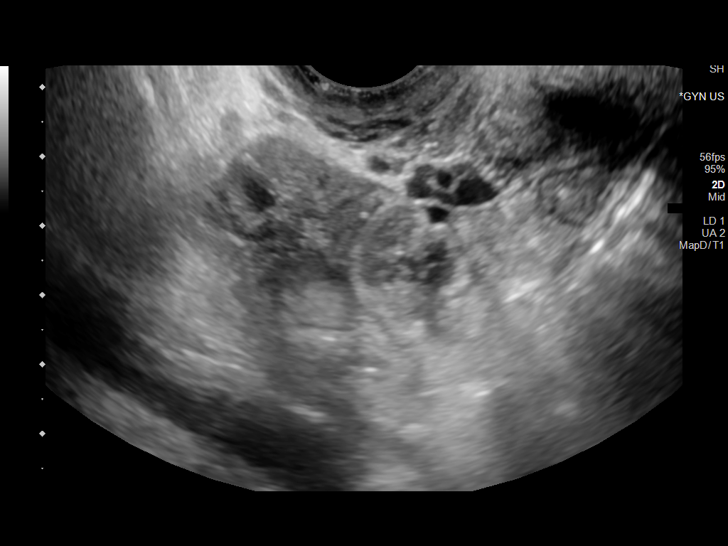
[im 73/98]
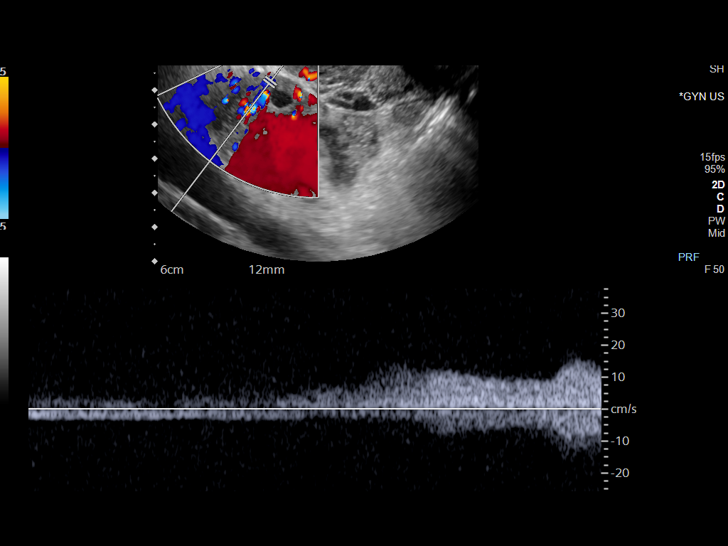
[im 81/98]
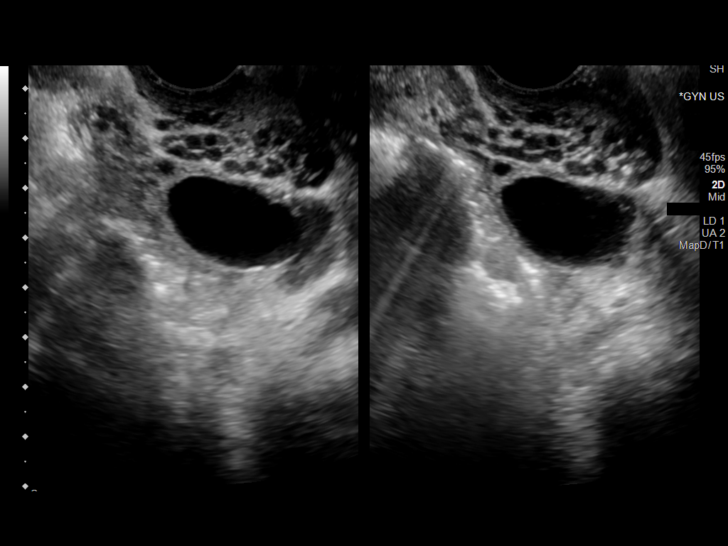
[im 89/98]
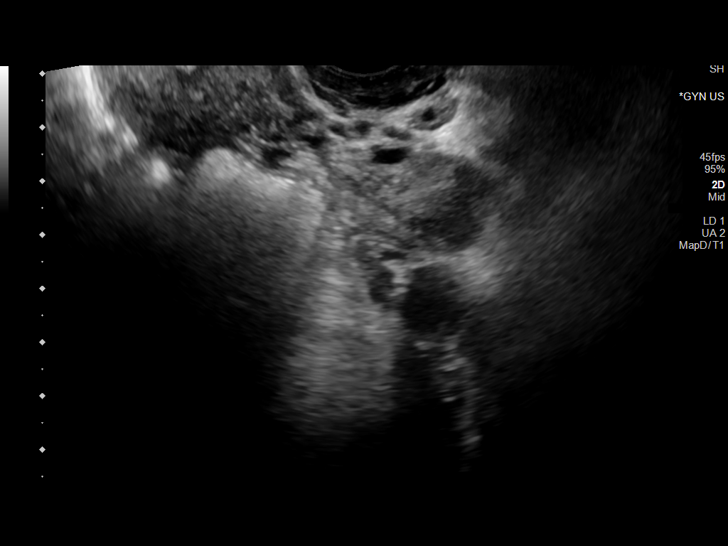
[im 98/98]
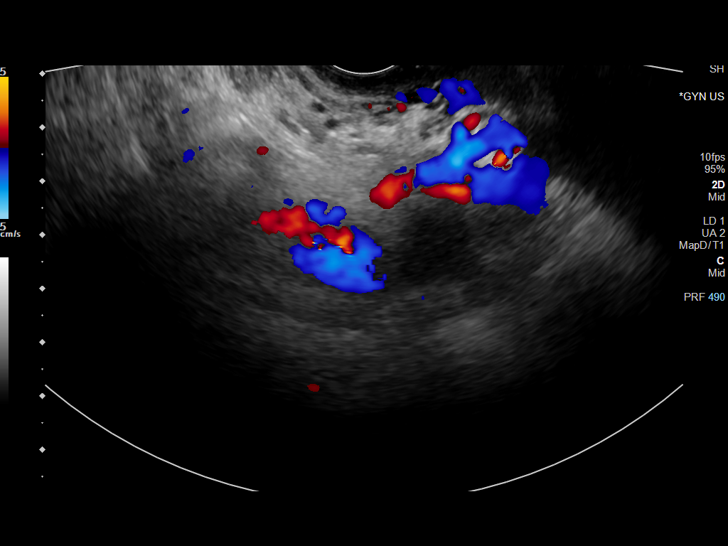

[13 of 25 positions shown; findings below may reference images not displayed]

FINDINGS: Uterus

Measurements: 6.4 x 3.9 x 4.3 cm. = volume: 56 mL. Diffuse
heterogeneity is again identified and stable. No discrete mass is
noted.

Endometrium

Thickness: 7 mm.  No focal abnormality visualized.

Right ovary

Measurements: 3.0 x 2.1 x 2.2 cm. = volume: 7.4 mL. Normal
appearance/no adnexal mass.

Left ovary

Measurements: 4.6 x 2.3 x 3.4 cm. = volume: 19.5 mL. 2.9 cm simple
cyst is noted

Pulsed Doppler evaluation of both ovaries demonstrates normal
low-resistance arterial and venous waveforms.

Other findings

No abnormal free fluid.
IMPRESSION: Mild heterogeneity in the uterus is noted and stable.

2.9 cm simple cyst in the left ovary. No followup imaging
recommended. Note: This recommendation does not apply to
premenarchal patients or to those with increased risk (genetic,
family history, elevated tumor markers or other high-risk factors)
of ovarian cancer. Reference: Radiology [DATE]):[id number].

## 2023-12-03 IMAGING — US US PELVIS COMPLETE TRANSABD/TRANSVAG W DUPLEX AND/OR DOPPLER
2 series · 13 of 25 positions shown · non-contrast
Comparison: 11/05/2027 pelvic sonogram

CLINICAL DATA: 40-year-old female with pelvic pain and recent STD
treatment. LMP 03/08/2022.

EXAM:
TRANSABDOMINAL AND TRANSVAGINAL ULTRASOUND OF PELVIS
DOPPLER ULTRASOUND OF OVARIES
TECHNIQUE: Both transabdominal and transvaginal ultrasound examinations of the
pelvis were performed. Transabdominal technique was performed for
global imaging of the pelvis including uterus, ovaries, adnexal
regions, and pelvic cul-de-sac.
It was necessary to proceed with endovaginal exam following the
transabdominal exam to visualize the endometrium and adnexa. Color
and duplex Doppler ultrasound was utilized to evaluate blood flow to
the ovaries.

[Series 1: us pelvis complete transabd/transvag w duplex and/ · 12 of 97 slices shown (1 of 2)]
[im 1/97]
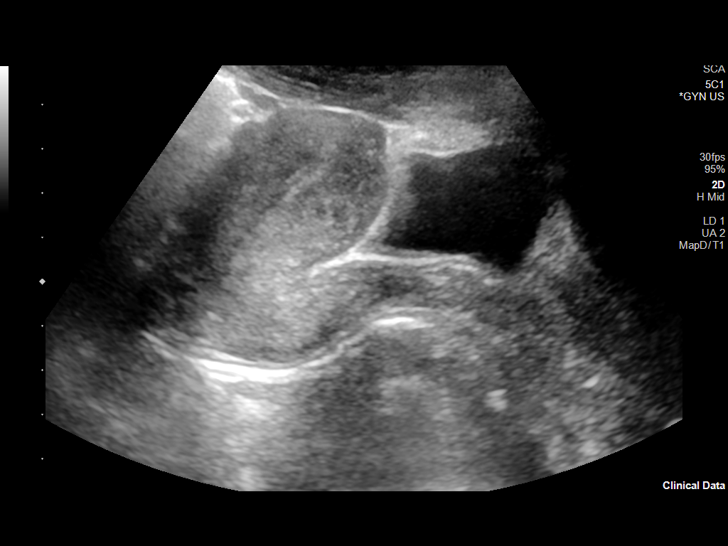
[im 9/97]
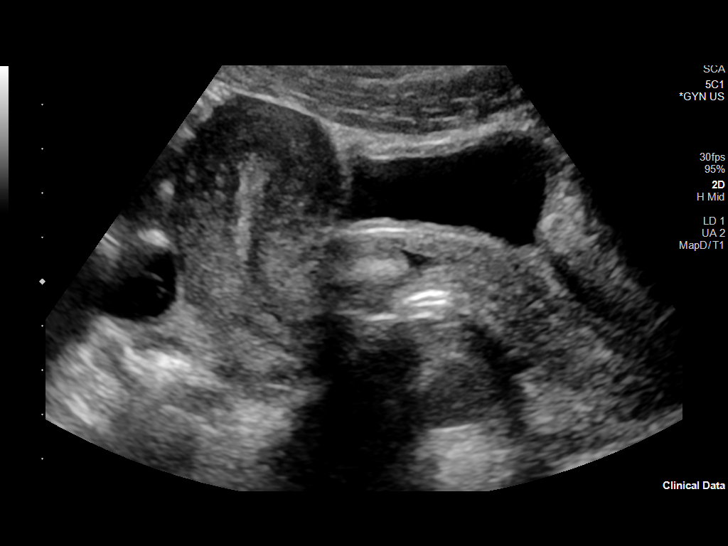
[im 17/97]
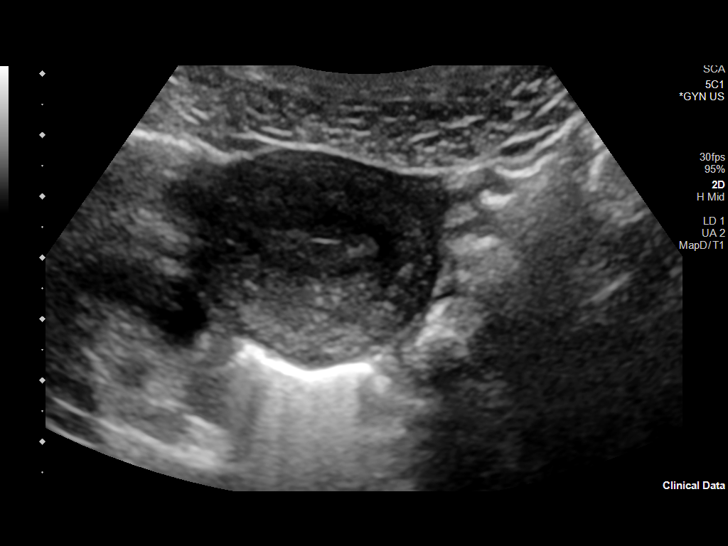
[im 26/97]
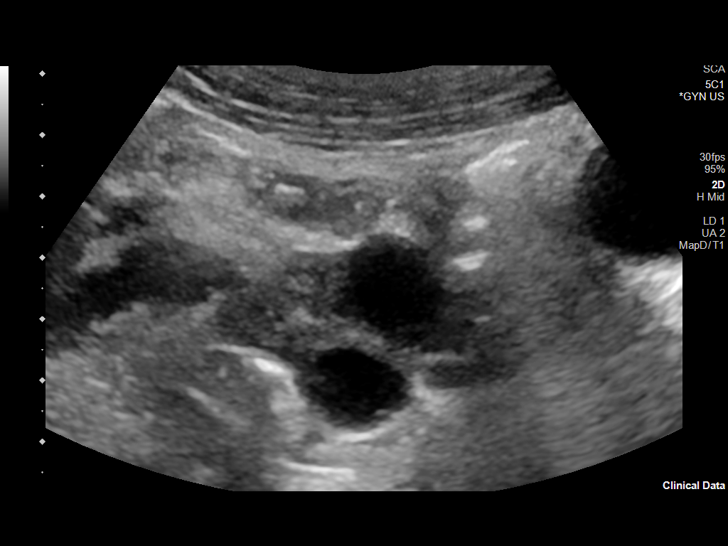
[im 34/97]
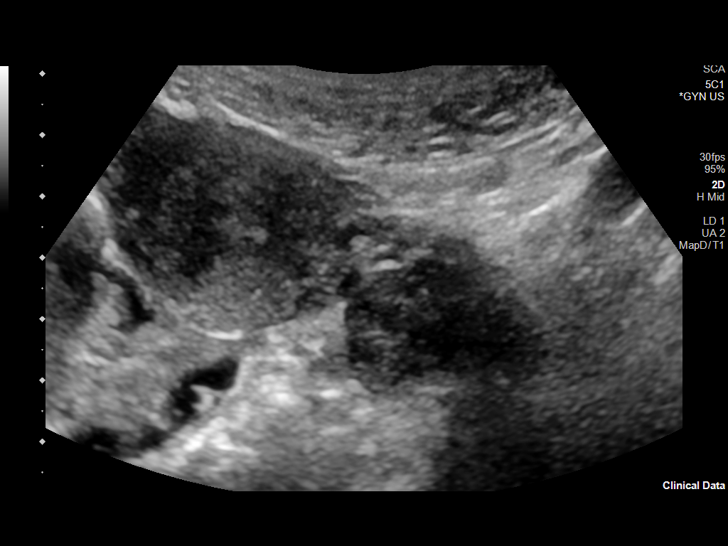
[im 42/97]
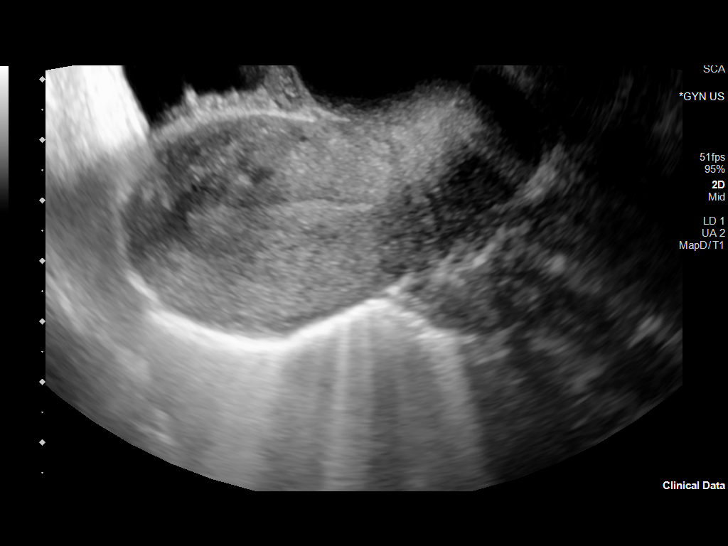
[im 51/97]
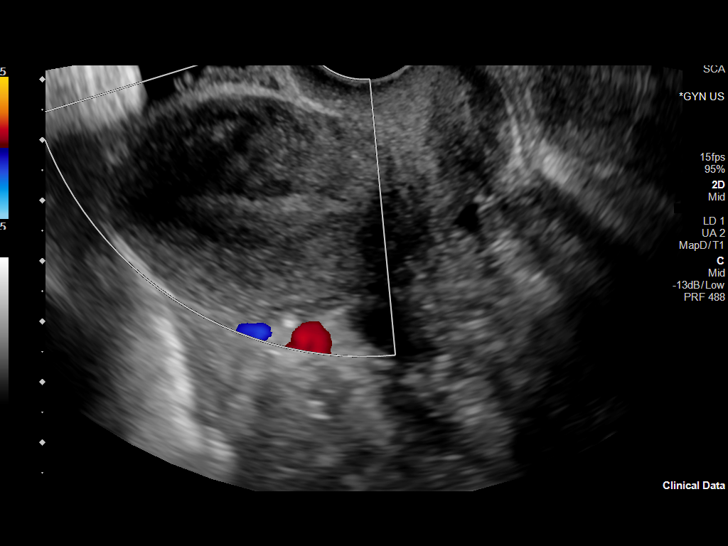
[im 59/97]
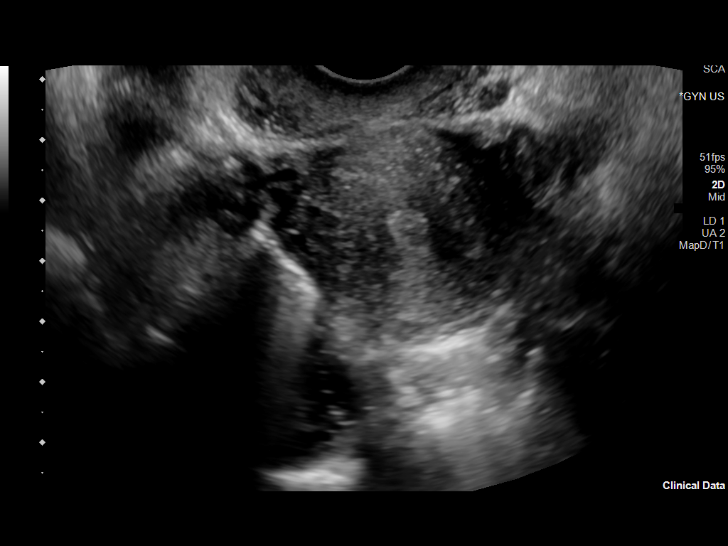
[im 67/97]
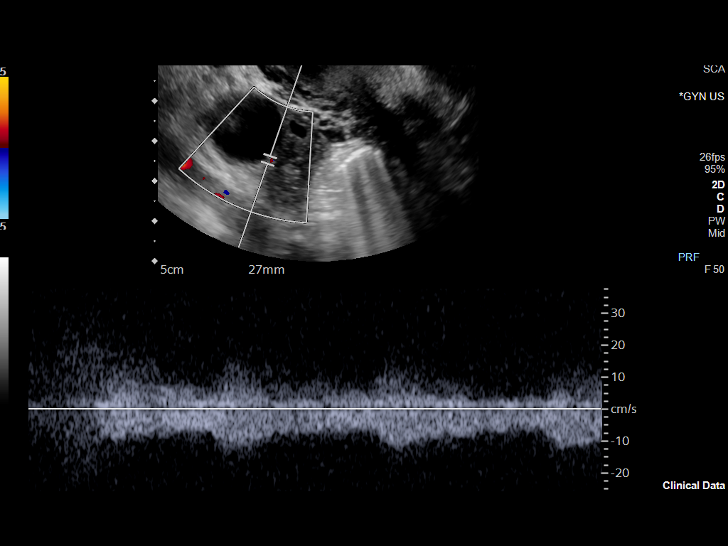
[im 76/97]
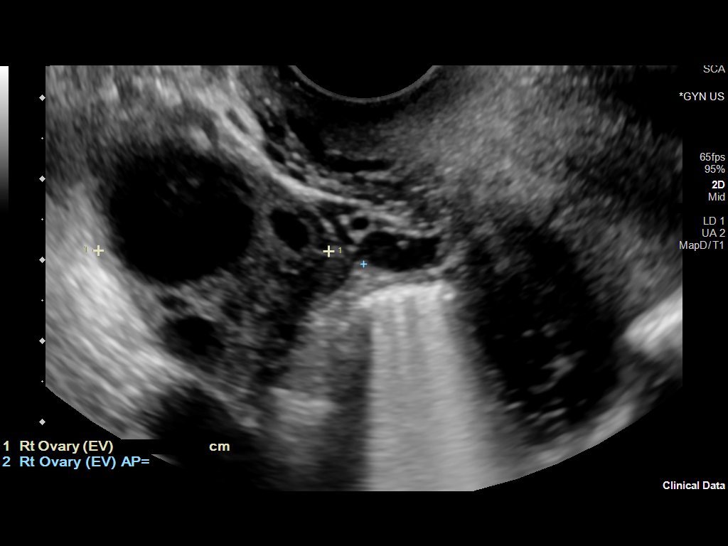
[im 84/97]
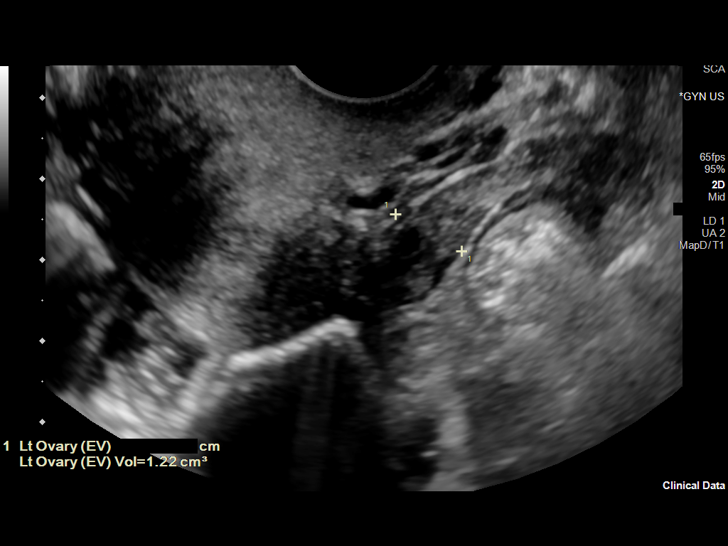
[im 92/97]
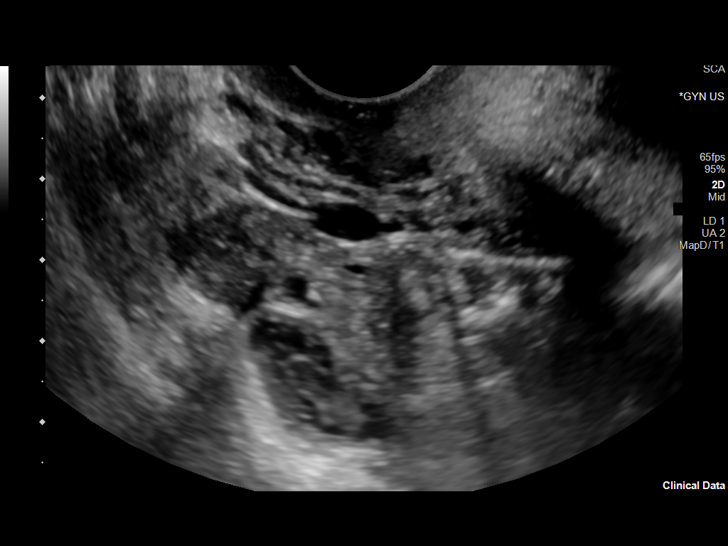

[Series 1001: us pelvis complete transabd/transvag w duplex and/ · 1 of 2 slices shown (2 of 2)]
[im 1/2]
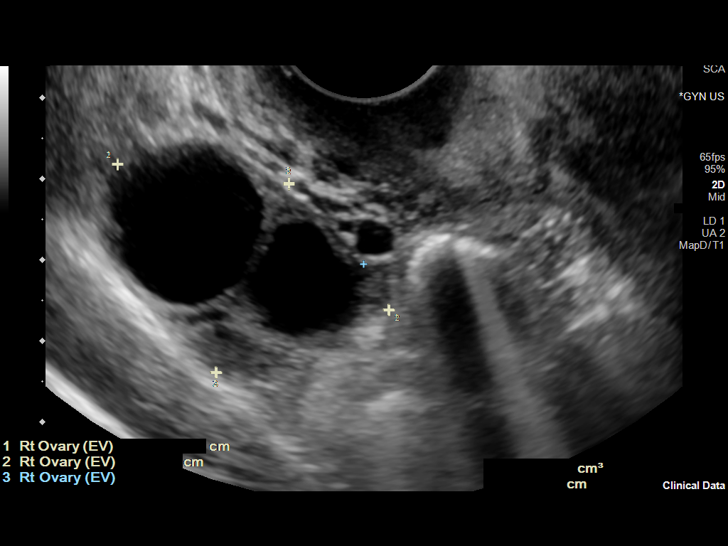

[13 of 25 positions shown; findings below may reference images not displayed]

FINDINGS: Uterus

Measurements: 6.2 x 3.7 x 4.1 cm = volume: 48 mL. Anteverted uterus
is normal in size and configuration, with no uterine fibroids or
other myometrial abnormalities.

Endometrium

Thickness: 6 mm. No endometrial cavity fluid or focal endometrial
mass.

Right ovary

Measurements: 3.8 x 2.5 x 2.8 cm = volume: 14.1 mL. Right ovary
x 1.7 x 1.8 cm cyst with low level eccentric heterogeneous internal
echoes and no convincing internal vascularity, compatible with a
hemorrhagic right ovarian cyst. Separate simple 1.5 x 1.5 x 1.5 cm
right ovarian cyst. No suspicious right ovarian or right adnexal
masses.

Left ovary

Measurements: 2.9 x 0.9 x 0.9 cm = volume: 1.2 mL. Normal
appearance/no adnexal mass.

Pulsed Doppler evaluation of both ovaries demonstrates normal
low-resistance arterial and venous waveforms.

Other findings

No abnormal free fluid.
IMPRESSION: 1. No evidence of adnexal torsion.
2. No suspicious adnexal masses. Right ovarian 1.9 cm hemorrhagic
cyst and separate simple 1.5 cm right ovarian follicular cyst. No
follow-up recommended.
3. Normal uterus.

## 2023-12-19 IMAGING — US US ABDOMEN LIMITED
1 series · 14 of 25 positions shown · non-contrast
Comparison: None.

CLINICAL DATA: Right upper quadrant pain

EXAM:
ULTRASOUND ABDOMEN LIMITED RIGHT UPPER QUADRANT

[Series 1: us abdomen limited · 0.15mm/px · 14 of 46 slices shown]
[im 1/46]
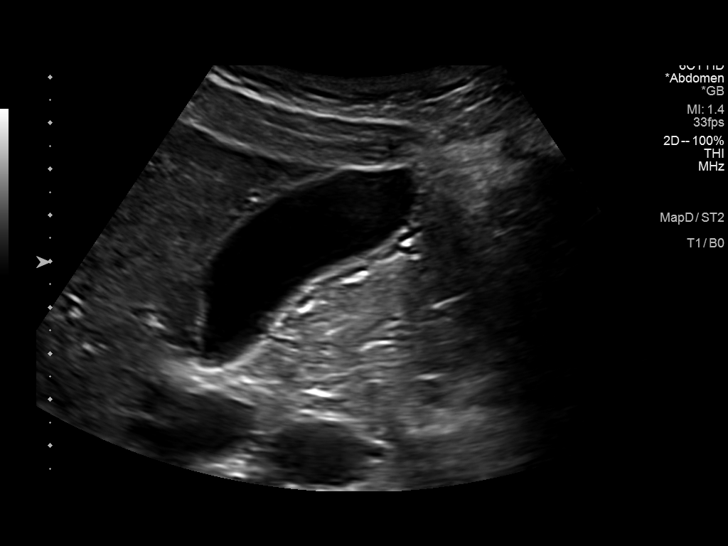
[im 4/46]
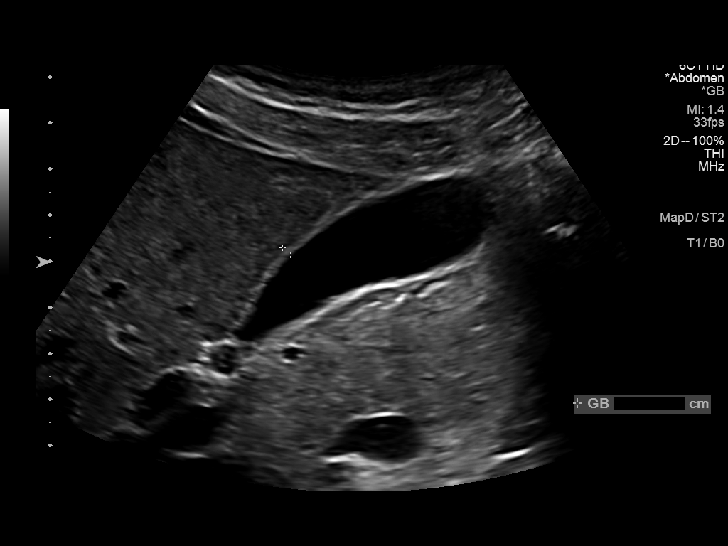
[im 8/46]
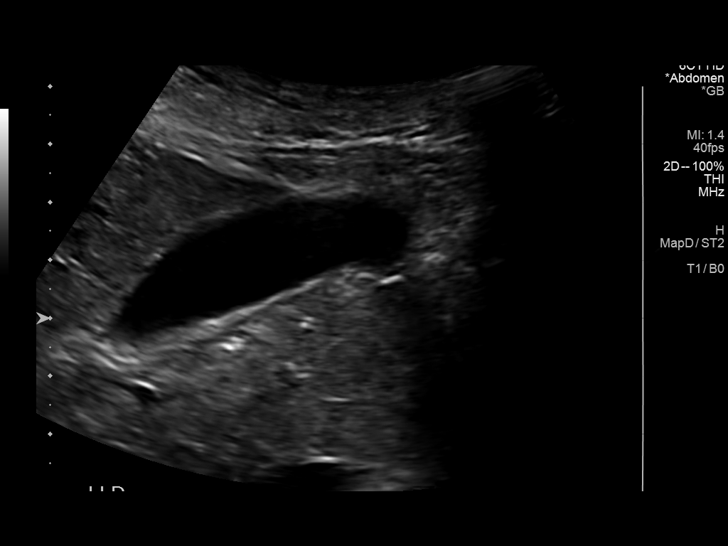
[im 12/46]
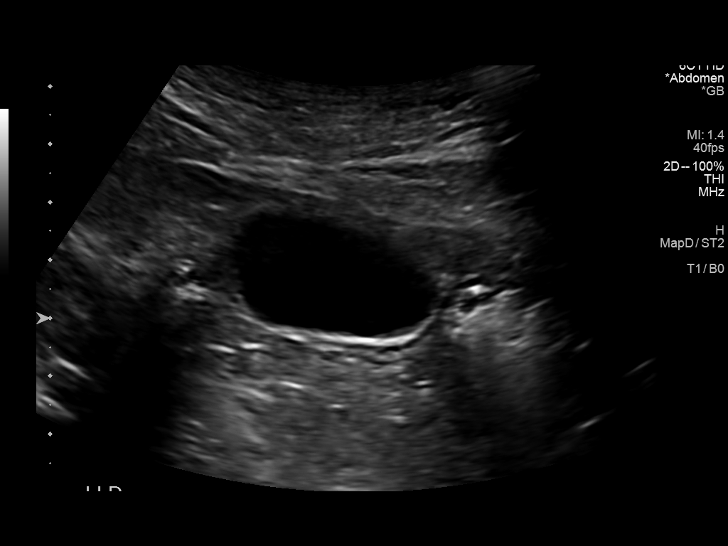
[im 16/46]
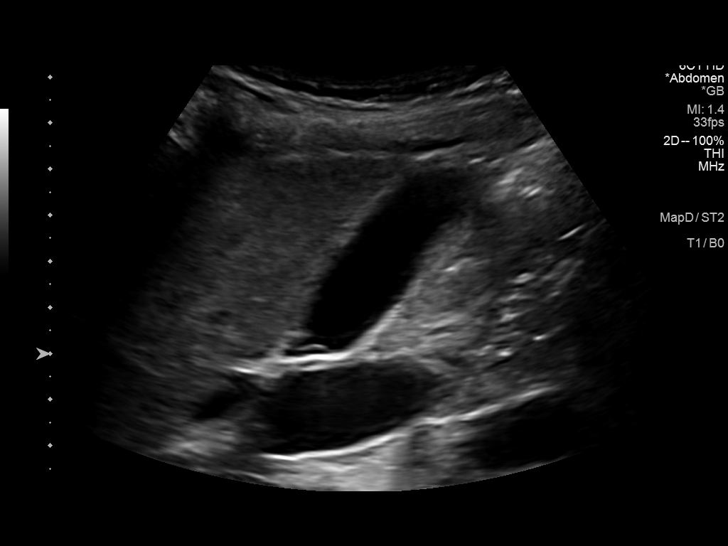
[im 17/46]
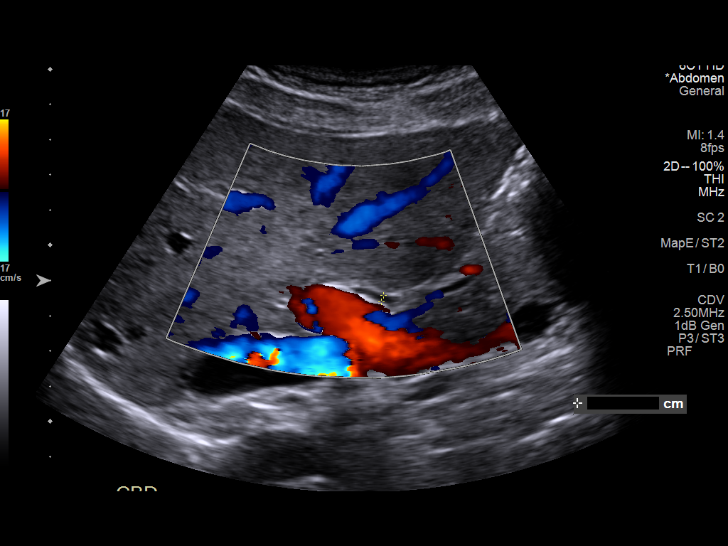
[im 21/46]
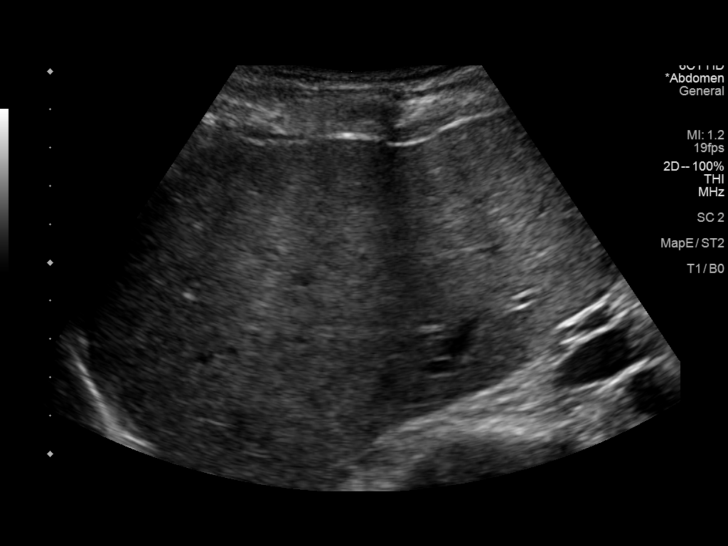
[im 25/46]
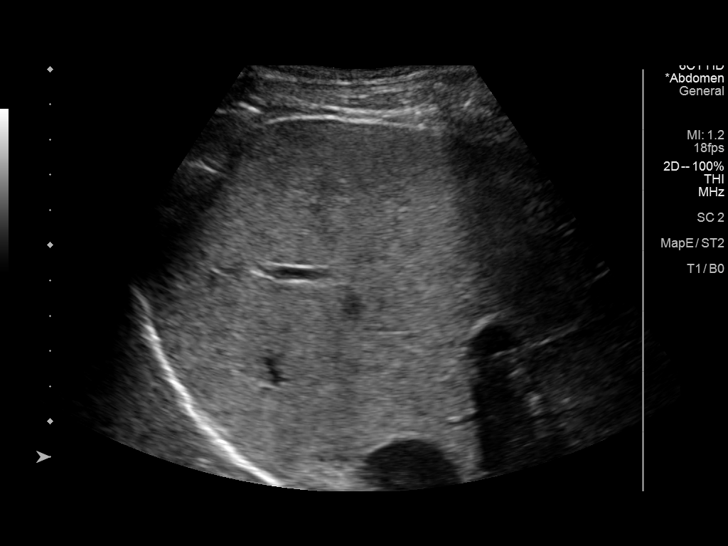
[im 29/46]
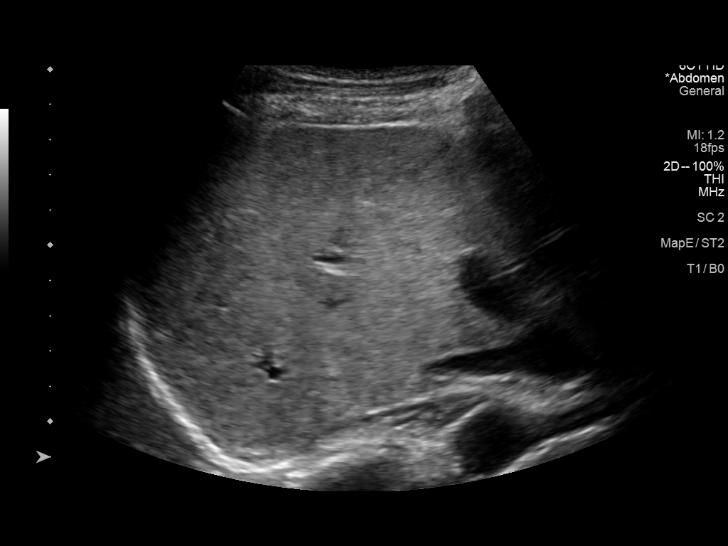
[im 31/46]
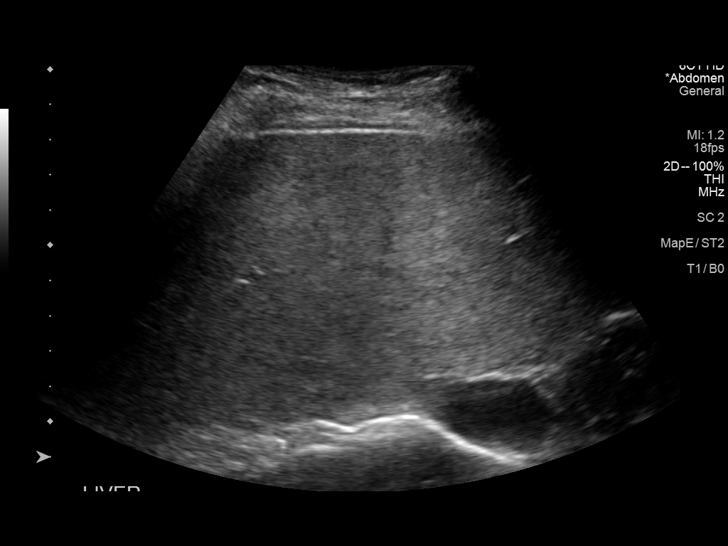
[im 34/46]
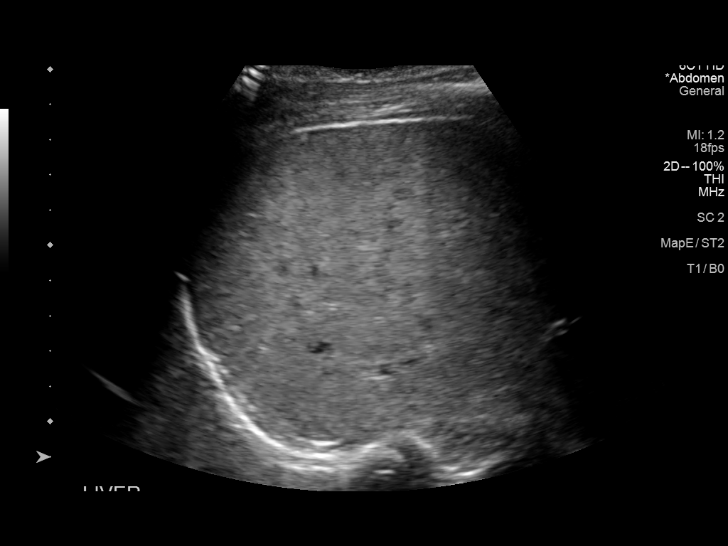
[im 38/46]
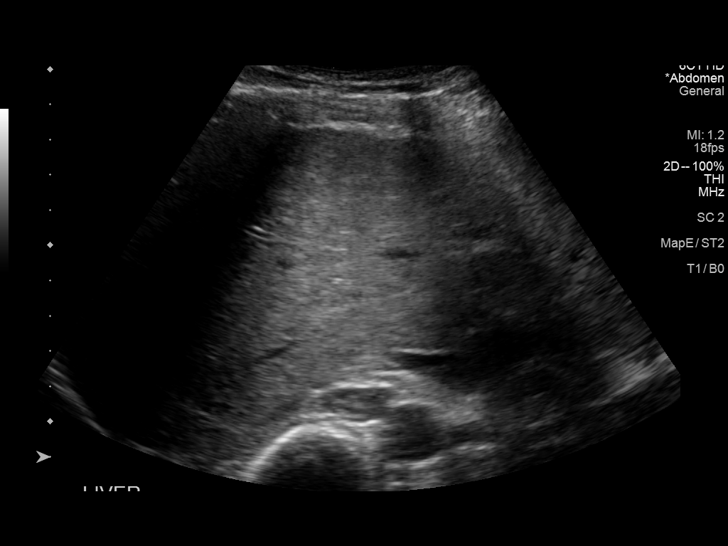
[im 42/46]
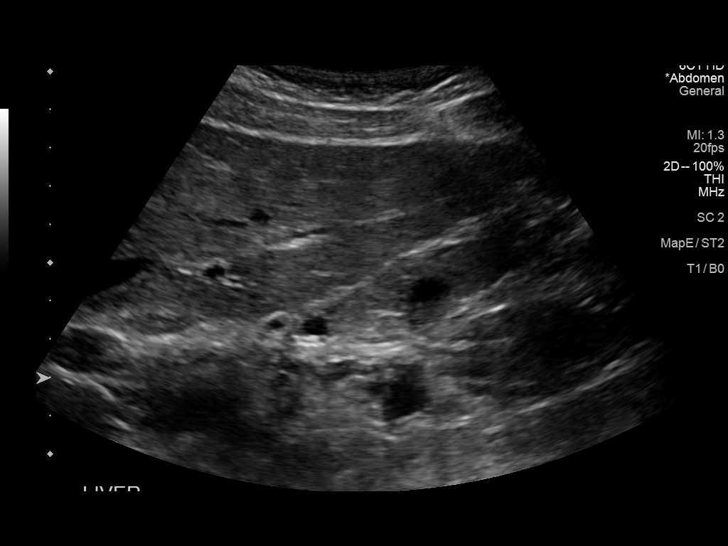
[im 46/46]
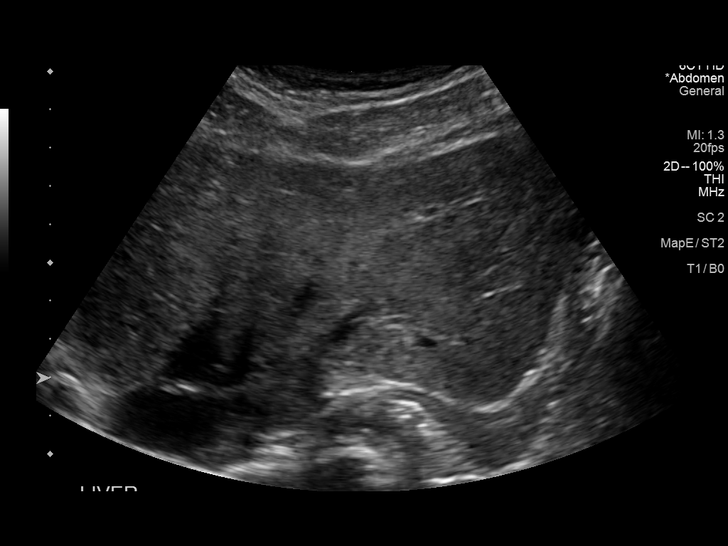

[14 of 25 positions shown; findings below may reference images not displayed]

FINDINGS: Gallbladder:

No gallstones or wall thickening visualized. No sonographic Murphy
sign noted by sonographer.

Common bile duct:

Diameter: 2 mm

Liver:

Inhomogeneous mildly increased echogenicity of the parenchyma. No
focal mass identified. Portal vein is patent on color Doppler
imaging with normal direction of blood flow towards the liver.

Other: None.
IMPRESSION: Inhomogeneous mild increased echogenicity of the liver parenchyma
which may represent hepatic steatosis.

## 2024-05-19 ENCOUNTER — Other Ambulatory Visit: Payer: Self-pay

## 2024-05-19 ENCOUNTER — Emergency Department (HOSPITAL_BASED_OUTPATIENT_CLINIC_OR_DEPARTMENT_OTHER)
Admission: EM | Admit: 2024-05-19 | Discharge: 2024-05-19 | Disposition: A | Discharge status: Home / Self Care | Attending: Emergency Medicine | Admitting: Emergency Medicine

## 2024-05-19 ENCOUNTER — Emergency Department (HOSPITAL_BASED_OUTPATIENT_CLINIC_OR_DEPARTMENT_OTHER)

## 2024-05-19 ENCOUNTER — Encounter (HOSPITAL_BASED_OUTPATIENT_CLINIC_OR_DEPARTMENT_OTHER): Payer: Self-pay

## 2024-05-19 DIAGNOSIS — R110 Nausea: Secondary | ICD-10-CM | POA: Insufficient documentation

## 2024-05-19 DIAGNOSIS — R519 Headache, unspecified: Secondary | ICD-10-CM | POA: Diagnosis present

## 2024-05-19 DIAGNOSIS — R079 Chest pain, unspecified: Secondary | ICD-10-CM | POA: Insufficient documentation

## 2024-05-19 DIAGNOSIS — J45909 Unspecified asthma, uncomplicated: Secondary | ICD-10-CM | POA: Diagnosis not present

## 2024-05-19 LAB — BASIC METABOLIC PANEL WITH GFR
Anion gap: 15 (ref 5–15)
BUN: 11 mg/dL (ref 6–20)
CO2: 25 mmol/L (ref 22–32)
Calcium: 9.6 mg/dL (ref 8.9–10.3)
Chloride: 100 mmol/L (ref 98–111)
Creatinine, Ser: 1 mg/dL (ref 0.44–1.00)
GFR, Estimated: >60 mL/min (ref >60)
Glucose, Bld: 79 mg/dL (ref 70–99)
Potassium: 3.4 mmol/L — ABNORMAL LOW (ref 3.5–5.1)
Sodium: 139 mmol/L (ref 135–145)

## 2024-05-19 LAB — CBC
HCT: 37.9 % (ref 36.0–46.0)
Hemoglobin: 12.7 g/dL (ref 12.0–15.0)
MCH: 32.3 pg (ref 26.0–34.0)
MCHC: 33.5 g/dL (ref 30.0–36.0)
MCV: 96.4 fL (ref 80.0–100.0)
Platelets: 227 10*3/uL (ref 150–400)
RBC: 3.93 MIL/uL (ref 3.87–5.11)
RDW: 14.2 % (ref 11.5–15.5)
WBC: 3.9 10*3/uL — ABNORMAL LOW (ref 4.0–10.5)
nRBC: 0 % (ref 0.0–0.2)

## 2024-05-19 LAB — HEPATIC FUNCTION PANEL
ALT: 18 U/L (ref 0–44)
AST: 27 U/L (ref 15–41)
Albumin: 4.8 g/dL (ref 3.5–5.0)
Alkaline Phosphatase: 52 U/L (ref 38–126)
Bilirubin, Direct: 0.4 mg/dL — ABNORMAL HIGH (ref 0.0–0.2)
Indirect Bilirubin: 0.3 mg/dL (ref 0.3–0.9)
Total Bilirubin: 0.7 mg/dL (ref 0.0–1.2)
Total Protein: 7.6 g/dL (ref 6.5–8.1)

## 2024-05-19 LAB — TROPONIN T, HIGH SENSITIVITY
Troponin T High Sensitivity: <15 ng/L (ref <19)
Troponin T High Sensitivity: <15 ng/L (ref <19)

## 2024-05-19 LAB — D-DIMER, QUANTITATIVE: D-Dimer, Quant: <0.27 ug{FEU}/mL (ref 0.00–0.50)

## 2024-05-19 LAB — HCG, SERUM, QUALITATIVE: Preg, Serum: NEGATIVE

## 2024-05-19 MED ORDER — ONDANSETRON 4 MG PO TBDP: 4.0000 mg | ORAL_TABLET | Freq: Three times a day (TID) | ORAL | 0 refills | Status: AC | PRN

## 2024-05-19 MED ORDER — ONDANSETRON HCL 4 MG/2ML IJ SOLN
4.0000 mg | Freq: Once | INTRAMUSCULAR | Status: AC
  Administered 2024-05-19: 4 mg via INTRAVENOUS
  Filled 2024-05-19: qty 2

## 2024-05-19 MED ORDER — SODIUM CHLORIDE 0.9 % IV BOLUS
1000.0000 mL | Freq: Once | INTRAVENOUS | Status: AC
  Administered 2024-05-19: 1000 mL via INTRAVENOUS

## 2024-05-19 MED ORDER — KETOROLAC TROMETHAMINE 30 MG/ML IJ SOLN
30.0000 mg | Freq: Once | INTRAMUSCULAR | Status: AC
  Administered 2024-05-19: 30 mg via INTRAVENOUS
  Filled 2024-05-19: qty 1

## 2024-05-19 MED ORDER — ONDANSETRON HCL 4 MG/2ML IJ SOLN: 4.0000 mg | Freq: Once | INTRAMUSCULAR | Status: DC

## 2024-05-19 NOTE — ED Triage Notes (Signed)
 Pt reports intermittent sharp left sided chest pain and headache for past 2 weeks. Symptoms went away briefly after seeing chiropractor last Wednesday and then started coming back 2 days after. Pain is non-radiating, 7/10. Hx of HTN.

## 2024-05-19 NOTE — Discharge Instructions (Addendum)
 It is important to establish care with a PCP.

## 2024-05-19 NOTE — ED Provider Notes (Addendum)
 [place] [facility] Provider Note   CSN: [id number] Arrival date & time: 05/19/24  2440     History  Chief Complaint  Patient presents with   Chest Pain   Headache    Morgan Hammond is a 43 y.o. female.  Pt is a 43 yo female with pmhx significant for asthma.  Pt said she has had a headache for a few days.  It has made it hard to sleep.  She developed left sided cp as well.  She said the chest pain is non-radiating. She does have a little sob, but it's resolved with using her inhaler. Pt denies f/c.       Home Medications Prior to Admission medications   Medication Sig Start Date End Date Taking? Authorizing Provider  ondansetron  (ZOFRAN -ODT) 4 MG disintegrating tablet Take 1 tablet (4 mg total) by mouth every 8 (eight) hours as needed. 05/19/24  Yes Sueellen Emery, MD  metroNIDAZOLE  (FLAGYL ) 500 MG tablet Take 1 tablet (500 mg total) by mouth 2 (two) times daily. One po bid x 7 days 02/16/22   Hershel Los, MD  ondansetron  (ZOFRAN  ODT) 8 MG disintegrating tablet Take 1 tablet (8 mg total) by mouth every 8 (eight) hours as needed for nausea or vomiting. 02/05/20   Camellia Caves, MD      Allergies    Penicillins    Review of Systems   Review of Systems  Cardiovascular:  Positive for chest pain.  Neurological:  Positive for headaches.  All other systems reviewed and are negative.   Physical Exam Updated Vital Signs BP (!) 121/93   Pulse (!) 49   Temp 98.1 F (36.7 C) (Oral)   Resp 18   Ht 5' 10.5" (1.791 m)   Wt 67.1 kg   SpO2 98%   BMI 20.94 kg/m  Physical Exam Vitals and nursing note reviewed.  Constitutional:      Appearance: She is well-developed.  HENT:     Head: Normocephalic and atraumatic.  Eyes:     Extraocular Movements: Extraocular movements intact.     Pupils: Pupils are equal, round, and reactive to light.  Cardiovascular:     Rate and Rhythm: Normal rate and regular rhythm.     Heart sounds: Normal  heart sounds.  Pulmonary:     Effort: Pulmonary effort is normal.     Breath sounds: Normal breath sounds.  Chest:    Abdominal:     General: Bowel sounds are normal.     Palpations: Abdomen is soft.  Musculoskeletal:        General: Normal range of motion.     Cervical back: Normal range of motion and neck supple.  Skin:    General: Skin is warm.     Capillary Refill: Capillary refill takes less than 2 seconds.  Neurological:     General: No focal deficit present.     Mental Status: She is alert and oriented to person, place, and time.  Psychiatric:        Mood and Affect: Mood normal.        Behavior: Behavior normal.     ED Results / Procedures / Treatments   Labs (all labs ordered are listed, but only abnormal results are displayed) Labs Reviewed  BASIC METABOLIC PANEL WITH GFR - Abnormal; Notable for the following components:      Result Value   Potassium 3.4 (*)    All other components within normal limits  CBC -  Abnormal; Notable for the following components:   WBC 3.9 (*)    All other components within normal limits  HEPATIC FUNCTION PANEL - Abnormal; Notable for the following components:   Bilirubin, Direct 0.4 (*)    All other components within normal limits  D-DIMER, QUANTITATIVE  HCG, SERUM, QUALITATIVE  TROPONIN T, HIGH SENSITIVITY  TROPONIN T, HIGH SENSITIVITY    EKG EKG Interpretation Date/Time:  Saturday May 19 2024 06:47:41 EDT Ventricular Rate:  69 PR Interval:  158 QRS Duration:  96 QT Interval:  407 QTC Calculation: 436 R Axis:   88  Text Interpretation: Sinus rhythm ST elev, probable normal early repol pattern Confirmed by Kelsey Patricia [phone]) on 05/19/2024 6:50:43 AM  Radiology DG Chest Port 1 View Result Date: 05/19/2024 CLINICAL DATA:  Chest pain EXAM: PORTABLE CHEST 1 VIEW COMPARISON:  03/30/2019 FINDINGS: The heart size and mediastinal contours are within normal limits. Both lungs are clear. The visualized skeletal structures are  unremarkable. IMPRESSION: No active disease. Electronically Signed   By: Kimberley Penman M.D.   On: 05/19/2024 07:57   CT Head Wo Contrast Result Date: 05/19/2024 CLINICAL DATA:  Headache for 2 weeks EXAM: CT HEAD WITHOUT CONTRAST TECHNIQUE: Contiguous axial images were obtained from the base of the skull through the vertex without intravenous contrast. RADIATION DOSE REDUCTION: This exam was performed according to the departmental dose-optimization program which includes automated exposure control, adjustment of the mA and/or kV according to patient size and/or use of iterative reconstruction technique. COMPARISON:  06/20/2012 FINDINGS: Brain: No evidence of acute infarction, hemorrhage, hydrocephalus, extra-axial collection or mass lesion/mass effect. Vascular: No hyperdense vessel or unexpected calcification. Skull: Normal. Negative for fracture or focal lesion. Sinuses/Orbits: No acute finding. IMPRESSION: Negative head CT. Electronically Signed   By: Ronnette Coke M.D.   On: 05/19/2024 07:49    Procedures Procedures    Medications Ordered in ED Medications  ondansetron  (ZOFRAN ) injection 4 mg (has no administration in time range)  ketorolac  (TORADOL ) 30 MG/ML injection 30 mg (30 mg Intravenous Given 05/19/24 0720)  sodium chloride  0.9 % bolus 1,000 mL (0 mLs Intravenous Stopped 05/19/24 0837)  ondansetron  (ZOFRAN ) injection 4 mg (4 mg Intravenous Given 05/19/24 0720)    ED Course/ Medical Decision Making/ A&P                                 Medical Decision Making Amount and/or Complexity of Data Reviewed Labs: ordered. Radiology: ordered.  Risk Prescription drug management.   This patient presents to the ED for concern of cp, this involves an extensive number of treatment options, and is a complaint that carries with it a high risk of complications and morbidity.  The differential diagnosis includes cardiac, pulm, gi, msk   Co morbidities that complicate the patient  evaluation  asthma   Additional history obtained:  Additional history obtained from epic chart review  Lab Tests:  I Ordered, and personally interpreted labs.  The pertinent results include:  cbc nl other than wbc 3.9, bmp nl, lfts nl, trop nl, ddimer nl   Imaging Studies ordered:  I ordered imaging studies including cxr, ct head  I independently visualized and interpreted imaging which showed  CXR: No active disease.  CT head: Negative head CT.  I agree with the radiologist interpretation   Cardiac Monitoring:  The patient was maintained on a cardiac monitor.  I personally viewed and interpreted the cardiac monitored which  showed an underlying rhythm of: nsr   Medicines ordered and prescription drug management:  I ordered medication including toradol , zofran , ivfs  for sx  Reevaluation of the patient after these medicines showed that the patient improved I have reviewed the patients home medicines and have made adjustments as needed   Test Considered:  ct   Critical Interventions:  Pain control   Problem List / ED Course:  CP:  atypical.  Likely msk.  Cardiac work up neg.  Ddimer neg. Pain reproducible with palpation. Headache:  CT neg.  H/a better with meds.  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  Pt is stable for d/c.  Return if worse.     Reevaluation:  After the interventions noted above, I reevaluated the patient and found that they have :improved   Social Determinants of Health:  No pcp   Dispostion:  After consideration of the diagnostic results and the patients response to treatment, I feel that the patent would benefit from discharge with outpatient f/u.          Final Clinical Impression(s) / ED Diagnoses Final diagnoses:  Nonspecific chest pain  Acute nonintractable headache, unspecified headache type  Nausea    Rx / DC Orders ED Discharge Orders          Ordered    ondansetron  (ZOFRAN -ODT) 4 MG  disintegrating tablet  Every 8 hours PRN        05/19/24 1030              Sueellen Emery, MD 05/19/24 1030    Sueellen Emery, MD 05/19/24 1032

## 2024-09-18 ENCOUNTER — Encounter (HOSPITAL_BASED_OUTPATIENT_CLINIC_OR_DEPARTMENT_OTHER): Payer: Self-pay

## 2024-09-18 ENCOUNTER — Other Ambulatory Visit: Payer: Self-pay

## 2024-09-18 ENCOUNTER — Emergency Department (HOSPITAL_BASED_OUTPATIENT_CLINIC_OR_DEPARTMENT_OTHER)
Admission: EM | Admit: 2024-09-18 | Discharge: 2024-09-18 | Disposition: A | Discharge status: Home / Self Care | Attending: Emergency Medicine | Admitting: Emergency Medicine

## 2024-09-18 DIAGNOSIS — K644 Residual hemorrhoidal skin tags: Secondary | ICD-10-CM | POA: Insufficient documentation

## 2024-09-18 DIAGNOSIS — K625 Hemorrhage of anus and rectum: Secondary | ICD-10-CM | POA: Diagnosis present

## 2024-09-18 LAB — CBC WITH DIFFERENTIAL/PLATELET
Abs Immature Granulocytes: 0 K/uL (ref 0.00–0.07)
Basophils Absolute: 0 K/uL (ref 0.0–0.1)
Basophils Relative: 1 %
Eosinophils Absolute: 0 K/uL (ref 0.0–0.5)
Eosinophils Relative: 0 %
HCT: 38 % (ref 36.0–46.0)
Hemoglobin: 12.7 g/dL (ref 12.0–15.0)
Immature Granulocytes: 0 %
Lymphocytes Relative: 35 %
Lymphs Abs: 1.5 K/uL (ref 0.7–4.0)
MCH: 32.1 pg (ref 26.0–34.0)
MCHC: 33.4 g/dL (ref 30.0–36.0)
MCV: 96 fL (ref 80.0–100.0)
Monocytes Absolute: 0.3 K/uL (ref 0.1–1.0)
Monocytes Relative: 8 %
Neutro Abs: 2.4 K/uL (ref 1.7–7.7)
Neutrophils Relative %: 56 %
Platelets: 211 K/uL (ref 150–400)
RBC: 3.96 MIL/uL (ref 3.87–5.11)
RDW: 13.5 % (ref 11.5–15.5)
WBC: 4.2 K/uL (ref 4.0–10.5)
nRBC: 0 % (ref 0.0–0.2)

## 2024-09-18 LAB — COMPREHENSIVE METABOLIC PANEL WITH GFR
ALT: 13 U/L (ref 0–44)
AST: 23 U/L (ref 15–41)
Albumin: 4.5 g/dL (ref 3.5–5.0)
Alkaline Phosphatase: 45 U/L (ref 38–126)
Anion gap: 12 (ref 5–15)
BUN: 7 mg/dL (ref 6–20)
CO2: 22 mmol/L (ref 22–32)
Calcium: 9 mg/dL (ref 8.9–10.3)
Chloride: 103 mmol/L (ref 98–111)
Creatinine, Ser: 0.77 mg/dL (ref 0.44–1.00)
GFR, Estimated: >60 mL/min (ref >60)
Glucose, Bld: 89 mg/dL (ref 70–99)
Potassium: 3.7 mmol/L (ref 3.5–5.1)
Sodium: 137 mmol/L (ref 135–145)
Total Bilirubin: 0.4 mg/dL (ref 0.0–1.2)
Total Protein: 7.1 g/dL (ref 6.5–8.1)

## 2024-09-18 LAB — URINALYSIS, ROUTINE W REFLEX MICROSCOPIC
Glucose, UA: NEGATIVE mg/dL
Ketones, ur: NEGATIVE mg/dL
Nitrite: NEGATIVE
Protein, ur: 30 mg/dL — AB
Specific Gravity, Urine: >=1.03 (ref 1.005–1.030)
pH: 5.5 (ref 5.0–8.0)

## 2024-09-18 LAB — URINALYSIS, MICROSCOPIC (REFLEX)

## 2024-09-18 LAB — PREGNANCY, URINE: Preg Test, Ur: NEGATIVE

## 2024-09-18 MED ORDER — HYDROCORTISONE (PERIANAL) 2.5 % EX CREA: 1.0000 | TOPICAL_CREAM | Freq: Two times a day (BID) | CUTANEOUS | 0 refills | Status: AC

## 2024-09-18 MED ORDER — FOSFOMYCIN TROMETHAMINE 3 G PO PACK
3.0000 g | PACK | Freq: Once | ORAL | Status: AC
  Administered 2024-09-18: 3 g via ORAL
  Filled 2024-09-18: qty 3

## 2024-09-18 NOTE — ED Triage Notes (Signed)
 Pt states she has had rectal bleeding x 1 day Does have hx of rectal bleeding, but never this bad Pt admits to drinking everyday Denies any pain

## 2024-09-18 NOTE — ED Provider Notes (Signed)
 [place] [facility] Provider Note   CSN: [id number] Arrival date & time: 09/18/24  0041     Patient presents with: Rectal Bleeding   Morgan Hammond is a 43 y.o. female.    Rectal Bleeding    43 year old female with medical history significant for hemorrhoids presenting to the emergency department with rectal bleeding for 1 day.  The patient states that she has had painless bleeding.  She has had problems with bleeding from hemorrhoids before but states that today she was alarmed by the amount of blood she saw on the toilet after a bowel movement.  She denies any specific hard bowel movements, denies any abdominal pain or rectal pain, no fevers or chills.    Prior to Admission medications   Medication Sig Start Date End Date Taking? Authorizing Provider  hydrocortisone  (ANUSOL -HC) 2.5 % rectal cream Place 1 Application rectally 2 (two) times daily. 09/18/24  Yes Jerrol Agent, MD  metroNIDAZOLE  (FLAGYL ) 500 MG tablet Take 1 tablet (500 mg total) by mouth 2 (two) times daily. One po bid x 7 days 02/16/22   Lenor Hollering, MD  ondansetron  (ZOFRAN  ODT) 8 MG disintegrating tablet Take 1 tablet (8 mg total) by mouth every 8 (eight) hours as needed for nausea or vomiting. 02/05/20   Schuyler Charlie RAMAN, MD  ondansetron  (ZOFRAN -ODT) 4 MG disintegrating tablet Take 1 tablet (4 mg total) by mouth every 8 (eight) hours as needed. 05/19/24   Dean Clarity, MD    Allergies: Penicillins    Review of Systems  Gastrointestinal:  Positive for hematochezia.  All other systems reviewed and are negative.   Updated Vital Signs BP (!) 113/91   Pulse 61   Temp 98.2 F (36.8 C) (Oral)   Resp 16   Ht 5' 11 (1.803 m)   Wt 63.5 kg   LMP 09/05/2024 (Exact Date)   SpO2 98%   BMI 19.53 kg/m   Physical Exam Vitals and nursing note reviewed.  Constitutional:      General: She is not in acute distress. HENT:     Head: Normocephalic and atraumatic.  Eyes:      Conjunctiva/sclera: Conjunctivae normal.     Pupils: Pupils are equal, round, and reactive to light.  Cardiovascular:     Rate and Rhythm: Normal rate and regular rhythm.  Pulmonary:     Effort: Pulmonary effort is normal. No respiratory distress.  Abdominal:     General: There is no distension.     Tenderness: There is no abdominal tenderness. There is no guarding.  Genitourinary:    General: Normal vulva.     Rectum: External hemorrhoid present.     Comments: External hemorrhoid present, nontender, not actively bleeding and nonthrombosed Musculoskeletal:        General: No deformity or signs of injury.     Cervical back: Neck supple.  Skin:    Findings: No lesion or rash.  Neurological:     General: No focal deficit present.     Mental Status: She is alert. Mental status is at baseline.     (all labs ordered are listed, but only abnormal results are displayed) Labs Reviewed  URINALYSIS, ROUTINE W REFLEX MICROSCOPIC - Abnormal; Notable for the following components:      Result Value   APPearance CLOUDY (*)    Hgb urine dipstick MODERATE (*)    Bilirubin Urine SMALL (*)    Protein, ur 30 (*)    Leukocytes,Ua SMALL (*)  All other components within normal limits  URINALYSIS, MICROSCOPIC (REFLEX) - Abnormal; Notable for the following components:   Bacteria, UA MANY (*)    All other components within normal limits  CBC WITH DIFFERENTIAL/PLATELET  COMPREHENSIVE METABOLIC PANEL WITH GFR  PREGNANCY, URINE    EKG: None  Radiology: No results found.   Procedures   Medications Ordered in the ED  fosfomycin (MONUROL ) packet 3 g (has no administration in time range)                                    Medical Decision Making Amount and/or Complexity of Data Reviewed Labs: ordered.  Risk Prescription drug management.     43 year old female with medical history significant for hemorrhoids presenting to the emergency department with rectal bleeding for 1 day.   The patient states that she has had painless bleeding.  She has had problems with bleeding from hemorrhoids before but states that today she was alarmed by the amount of blood she saw on the toilet after a bowel movement.  She denies any specific hard bowel movements, denies any abdominal pain or rectal pain, no fevers or chills.    On arrival, the patient was vitally stable.  Presenting with concern for bleeding hemorrhoid.  Symptoms appear to have resolved.  On exam the patient had a palpable external hemorrhoid that was nonthrombosed and not actively bleeding.  She has no pain at this time.  Labs: CMP and CBC unremarkable, hemoglobin 12.7, urine pregnancy negative, urinalysis with evidence of UTI with small leukocytes, 6-10 WBCs and many bacteria, however squamous 11-20 present.  One-time dose of fosfomycin was administered.  Patient overall hemodynamically stable, stable vitals, normal hemoglobin, not actively bleeding from her hemorrhoid and nonthrombosed on exam.  Referral placed for outpatient follow-up with general surgery, advised sitz bath's, Anusol  cream and outpatient follow-up as needed.     Final diagnoses:  External hemorrhoid, bleeding    ED Discharge Orders          Ordered    Ambulatory referral to General Surgery        09/18/24 0353    hydrocortisone  (ANUSOL -HC) 2.5 % rectal cream  2 times daily        09/18/24 0353               Jerrol Agent, MD 09/18/24 [phone]

## 2024-09-18 NOTE — Discharge Instructions (Addendum)
 Your exam revealed evidence of an external hemorrhoid that was not thrombosed, your hemoglobin was stable and your rectal bleeding has stopped.  Recommend sitz bath's, Anusol  cream and follow-up outpatient with general surgery for continued management.
